# Patient Record
Sex: Male | Born: 1960 | Race: White | Hispanic: No | Marital: Married | Smoking: Never smoker
Health system: Southern US, Community
[De-identification: ages and names within clinical notes are randomized; demographics above are authoritative.]

## PROBLEM LIST (undated history)

## (undated) DIAGNOSIS — E785 Hyperlipidemia, unspecified: Secondary | ICD-10-CM

## (undated) DIAGNOSIS — I10 Essential (primary) hypertension: Secondary | ICD-10-CM

## (undated) HISTORY — PX: MANDIBLE SURGERY: SHX707

## (undated) HISTORY — PX: APPENDECTOMY: SHX54

## (undated) HISTORY — PX: NOSE SURGERY: SHX723

---

## 2011-12-12 ENCOUNTER — Emergency Department: Payer: Self-pay | Admitting: Emergency Medicine

## 2019-10-07 ENCOUNTER — Other Ambulatory Visit: Payer: Self-pay

## 2019-10-07 ENCOUNTER — Encounter (HOSPITAL_COMMUNITY): Payer: Self-pay

## 2019-10-07 ENCOUNTER — Emergency Department (HOSPITAL_COMMUNITY)
Admission: EM | Admit: 2019-10-07 | Discharge: 2019-10-07 | Disposition: A | Discharge status: Home / Self Care | Payer: 59 | Attending: Emergency Medicine | Admitting: Emergency Medicine

## 2019-10-07 ENCOUNTER — Emergency Department (HOSPITAL_COMMUNITY): Payer: 59

## 2019-10-07 DIAGNOSIS — U071 COVID-19: Secondary | ICD-10-CM | POA: Insufficient documentation

## 2019-10-07 DIAGNOSIS — I10 Essential (primary) hypertension: Secondary | ICD-10-CM | POA: Insufficient documentation

## 2019-10-07 DIAGNOSIS — Z79899 Other long term (current) drug therapy: Secondary | ICD-10-CM | POA: Insufficient documentation

## 2019-10-07 DIAGNOSIS — R509 Fever, unspecified: Secondary | ICD-10-CM | POA: Diagnosis not present

## 2019-10-07 DIAGNOSIS — M7918 Myalgia, other site: Secondary | ICD-10-CM | POA: Diagnosis not present

## 2019-10-07 DIAGNOSIS — R05 Cough: Secondary | ICD-10-CM | POA: Insufficient documentation

## 2019-10-07 DIAGNOSIS — R519 Headache, unspecified: Secondary | ICD-10-CM | POA: Insufficient documentation

## 2019-10-07 DIAGNOSIS — J1289 Other viral pneumonia: Secondary | ICD-10-CM | POA: Insufficient documentation

## 2019-10-07 DIAGNOSIS — M549 Dorsalgia, unspecified: Secondary | ICD-10-CM | POA: Diagnosis not present

## 2019-10-07 DIAGNOSIS — J1282 Pneumonia due to coronavirus disease 2019: Secondary | ICD-10-CM

## 2019-10-07 DIAGNOSIS — R0602 Shortness of breath: Secondary | ICD-10-CM | POA: Diagnosis present

## 2019-10-07 HISTORY — DX: Hyperlipidemia, unspecified: E78.5

## 2019-10-07 HISTORY — DX: Essential (primary) hypertension: I10

## 2019-10-07 LAB — CBC WITH DIFFERENTIAL/PLATELET
Abs Immature Granulocytes: 0.03 10*3/uL (ref 0.00–0.07)
Basophils Absolute: 0 10*3/uL (ref 0.0–0.1)
Basophils Relative: 0 %
Eosinophils Absolute: 0 10*3/uL (ref 0.0–0.5)
Eosinophils Relative: 0 %
HCT: 41.5 % (ref 39.0–52.0)
Hemoglobin: 14.4 g/dL (ref 13.0–17.0)
Immature Granulocytes: 1 %
Lymphocytes Relative: 13 %
Lymphs Abs: 0.8 10*3/uL (ref 0.7–4.0)
MCH: 31.4 pg (ref 26.0–34.0)
MCHC: 34.7 g/dL (ref 30.0–36.0)
MCV: 90.4 fL (ref 80.0–100.0)
Monocytes Absolute: 0.7 10*3/uL (ref 0.1–1.0)
Monocytes Relative: 11 %
Neutro Abs: 4.7 10*3/uL (ref 1.7–7.7)
Neutrophils Relative %: 75 %
Platelets: 159 10*3/uL (ref 150–400)
RBC: 4.59 MIL/uL (ref 4.22–5.81)
RDW: 13.1 % (ref 11.5–15.5)
WBC: 6.2 10*3/uL (ref 4.0–10.5)
nRBC: 0 % (ref 0.0–0.2)

## 2019-10-07 LAB — BASIC METABOLIC PANEL
Anion gap: 9 (ref 5–15)
BUN: 19 mg/dL (ref 6–20)
CO2: 20 mmol/L — ABNORMAL LOW (ref 22–32)
Calcium: 8.1 mg/dL — ABNORMAL LOW (ref 8.9–10.3)
Chloride: 111 mmol/L (ref 98–111)
Creatinine, Ser: 0.97 mg/dL (ref 0.61–1.24)
GFR calc Af Amer: >60 mL/min (ref >60)
GFR calc non Af Amer: >60 mL/min (ref >60)
Glucose, Bld: 119 mg/dL — ABNORMAL HIGH (ref 70–99)
Potassium: 3.8 mmol/L (ref 3.5–5.1)
Sodium: 140 mmol/L (ref 135–145)

## 2019-10-07 LAB — CBG MONITORING, ED: Glucose-Capillary: 96 mg/dL (ref 70–99)

## 2019-10-07 MED ORDER — SODIUM CHLORIDE 0.9 % IV BOLUS
1000.0000 mL | Freq: Once | INTRAVENOUS | Status: AC
  Administered 2019-10-07: 20:00:00 1000 mL via INTRAVENOUS

## 2019-10-07 MED ORDER — ACETAMINOPHEN 325 MG PO TABS
650.0000 mg | ORAL_TABLET | Freq: Once | ORAL | Status: AC
  Administered 2019-10-07: 650 mg via ORAL
  Filled 2019-10-07: qty 2

## 2019-10-07 MED ORDER — ALBUTEROL SULFATE HFA 108 (90 BASE) MCG/ACT IN AERS
1.0000 | INHALATION_SPRAY | Freq: Once | RESPIRATORY_TRACT | Status: AC
  Administered 2019-10-07: 2 via RESPIRATORY_TRACT
  Filled 2019-10-07: qty 6.7

## 2019-10-07 NOTE — ED Notes (Signed)
Walking Sa02 91% on RA

## 2019-10-07 NOTE — ED Provider Notes (Addendum)
[facility] EMERGENCY DEPARTMENT Provider Note   CSN: [id number] Arrival date & time: 10/07/19  1944     History   Chief Complaint Chief Complaint  Patient presents with   Shortness of Breath    HPI Steve Briggs is a 58 y.o. male who presents with SOB and syncope. PMH significant for HTN and HLD. He states that he was diagnosed with COVID-19 9 days ago. Overall he has been doing well up until today. He states that throughout the week he has had a headache, fever, body aches but symptoms have been manageable. Today he has had worsening SOB, worsening dry cough, generalized weakness, and back pain. He states that his back hurts mainly when he coughs. After a bad coughing fit he got up to get his keys and then had a sudden syncopal episode. Then he decided to call EMS because he didn't want to drive. His wife also has COVID and is [facility]. He denies chest pain, abdominal pain. He had N/V earlier in the week which has resolved. No diarrhea or urinary symptoms. He has been eating and drinking.      HPI  Past Medical History:  Diagnosis Date   Hyperlipidemia    Hypertension     There are no active problems to display for this patient.   Past Surgical History:  Procedure Laterality Date   APPENDECTOMY     MANDIBLE SURGERY     NOSE SURGERY          Home Medications    Prior to Admission medications   Medication Sig Start Date End Date Taking? Authorizing Provider  Ascorbic Acid (VITAMIN C PO) Take 1 tablet by mouth daily.   Yes [provider]  ibuprofen (ADVIL) 200 MG tablet Take 800 mg by mouth every 6 (six) hours as needed for moderate pain.   Yes [provider]  losartan (COZAAR) 100 MG tablet Take 100 mg by mouth daily. 09/24/19  Yes [provider]  Multiple Vitamins-Minerals (ZINC PO) Take 1 tablet by mouth daily.   Yes [provider]  simvastatin (ZOCOR) 20 MG tablet Take 20 mg  by mouth daily. 09/02/19  Yes [provider]  topiramate (TOPAMAX) 50 MG tablet Take 50 mg by mouth daily. 09/22/19  Yes [provider]  VITAMIN D PO Take 1 tablet by mouth daily.   Yes [provider]    Family History No family history on file.  Social History Social History   Tobacco Use   Smoking status: Never Smoker   Smokeless tobacco: Never Used  Substance Use Topics   Alcohol use: Yes    Comment: occ   Drug use: Never     Allergies   Codeine   Review of Systems Review of Systems  Constitutional: Positive for fatigue and fever.  Respiratory: Positive for cough and shortness of breath.   Cardiovascular: Negative for chest pain and leg swelling.  Gastrointestinal: Positive for nausea and vomiting (resolved). Negative for abdominal pain and diarrhea.  Genitourinary: Negative for difficulty urinating.  Musculoskeletal: Positive for back pain.  Neurological: Positive for syncope, weakness (generalized) and headaches.  All other systems reviewed and are negative.    Physical Exam Updated Vital Signs BP 131/88    Pulse 79    Temp (!) 100.6 F (38.1 C) (Oral)    Resp 18    Ht 5\' 11"  (1.803 m)    Wt 82.6 kg    SpO2 95%  BMI 25.38 kg/m   Physical Exam Vitals signs and nursing note reviewed.  Constitutional:      General: He is not in acute distress.    Appearance: Normal appearance. He is well-developed. He is not ill-appearing.     Comments: Calm, cooperative. NAD  HENT:     Head: Normocephalic and atraumatic.  Eyes:     General: No scleral icterus.       Right eye: No discharge.        Left eye: No discharge.     Conjunctiva/sclera: Conjunctivae normal.     Pupils: Pupils are equal, round, and reactive to light.  Neck:     Musculoskeletal: Normal range of motion.  Cardiovascular:     Rate and Rhythm: Normal rate and regular rhythm.  Pulmonary:     Effort: Pulmonary effort is normal. No respiratory distress.     Breath  sounds: Normal breath sounds.  Abdominal:     General: There is no distension.     Palpations: Abdomen is soft.     Tenderness: There is no abdominal tenderness.  Skin:    General: Skin is warm and dry.  Neurological:     Mental Status: He is alert and oriented to person, place, and time.  Psychiatric:        Behavior: Behavior normal.      ED Treatments / Results  Labs (all labs ordered are listed, but only abnormal results are displayed) Labs Reviewed  BASIC METABOLIC PANEL - Abnormal; Notable for the following components:      Result Value   CO2 20 (*)    Glucose, Bld 119 (*)    Calcium 8.1 (*)    All other components within normal limits  CBC WITH DIFFERENTIAL/PLATELET  CBG MONITORING, ED    EKG EKG Interpretation  Date/Time:  Sunday October 07 2019 19:48:07 EDT Ventricular Rate:  84 PR Interval:    QRS Duration: 98 QT Interval:  390 QTC Calculation: 461 R Axis:   6 Text Interpretation:  Sinus rhythm Abnormal R-wave progression, early transition Left ventricular hypertrophy Borderline T abnormalities, inferior leads Confirmed by Virgina Norfolk [phone]) on 10/07/2019 7:52:00 PM   Radiology Dg Chest Port 1 View  Result Date: 10/07/2019 CLINICAL DATA:  58 year old male with shortness of breath. Syncope today. Positive for COVID-19 about 10 days ago. EXAM: PORTABLE CHEST 1 VIEW COMPARISON:  None. FINDINGS: Portable AP semi upright view at 2006 hours. Confluent but indistinct asymmetric peripheral left lung opacity. Low normal lung volumes. Normal cardiac size and mediastinal contours. Visualized tracheal air column is within normal limits. The right lung is clear allowing for portable technique. No pleural effusion. Paucity of bowel gas. No acute osseous abnormality identified. IMPRESSION: Indistinct peripheral opacity in the left lung compatible with COVID-19 pneumonia. No pleural effusion. Electronically Signed   By: Odessa Fleming M.D.   On: 10/07/2019 20:37     Procedures Procedures (including critical care time)  Medications Ordered in ED Medications  albuterol (VENTOLIN HFA) 108 (90 Base) MCG/ACT inhaler 1-2 puff (has no administration in time range)  sodium chloride 0.9 % bolus 1,000 mL (0 mLs Intravenous Stopped 10/07/19 2209)  acetaminophen (TYLENOL) tablet 650 mg (650 mg Oral Given 10/07/19 2128)     Initial Impression / Assessment and Plan / ED Course  I have reviewed the triage vital signs and the nursing notes.  Pertinent labs & imaging results that were available during my care of the patient were reviewed by me and considered in  my medical decision making (see chart for details).  58 year old male presents with SOB, cough, syncopal episode. He has a low grade fever here but otherwise vitals are normal. He is not in any distress and does not appear very ill. Will obtain labs, CXR. Will give fluids and tylenol as he feels very weak. EKG is SR with LVH and borderline T wave abnormalities.  CXR shows "indistinct peripheral opacity" in the left lung. Labs are essentially unremarkable. After fluids he feels better. He ambulated in the room and sats dropped to only 91%. At rest he is 95-96%. I have low suspicion for PE as he has no clinical signs of DVT and he does not complain of chest pain and has a clear reason for his SOB. At this time, supportive care is recommended. He was given strict return precautions. He has a pulse oximeter and can monitor his sats at home.    Final Clinical Impressions(s) / ED Diagnoses   Final diagnoses:  Pneumonia due to COVID-19 virus    ED Discharge Orders    None           Recardo Evangelist, PA-C 10/07/19 2335    Lennice Sites, DO 10/08/19 0007

## 2019-10-07 NOTE — ED Triage Notes (Signed)
Pt arrived via [facility], pt COVID +, 10 days into it. Pt started having increased SOB and worse cough that started 3 hrs ago. Pt had syncopal episode today.  Pt A&Ox4. Pt is NSR on monitor. Skin color is WNL.

## 2019-10-07 NOTE — Discharge Instructions (Signed)
Use inhaler as needed for shortness of breath Take over the counter cough/cold medicine as needed Please monitor your O2 sats and return to the hospital if your O2 is persistently below 94% or your are becoming more short of breath

## 2019-10-07 NOTE — ED Notes (Signed)
Patient transported to MRI 

## 2020-12-31 DIAGNOSIS — I671 Cerebral aneurysm, nonruptured: Secondary | ICD-10-CM | POA: Diagnosis not present

## 2020-12-31 DIAGNOSIS — G43009 Migraine without aura, not intractable, without status migrainosus: Secondary | ICD-10-CM | POA: Diagnosis not present

## 2020-12-31 DIAGNOSIS — K219 Gastro-esophageal reflux disease without esophagitis: Secondary | ICD-10-CM | POA: Diagnosis not present

## 2020-12-31 DIAGNOSIS — I10 Essential (primary) hypertension: Secondary | ICD-10-CM | POA: Diagnosis not present

## 2021-01-15 DIAGNOSIS — L814 Other melanin hyperpigmentation: Secondary | ICD-10-CM | POA: Diagnosis not present

## 2021-01-15 DIAGNOSIS — D1801 Hemangioma of skin and subcutaneous tissue: Secondary | ICD-10-CM | POA: Diagnosis not present

## 2021-01-15 DIAGNOSIS — X32XXXS Exposure to sunlight, sequela: Secondary | ICD-10-CM | POA: Diagnosis not present

## 2021-01-15 DIAGNOSIS — L821 Other seborrheic keratosis: Secondary | ICD-10-CM | POA: Diagnosis not present

## 2021-03-03 ENCOUNTER — Encounter: Payer: Self-pay | Admitting: Sports Medicine

## 2021-03-03 ENCOUNTER — Ambulatory Visit (INDEPENDENT_AMBULATORY_CARE_PROVIDER_SITE_OTHER): Payer: BC Managed Care – PPO

## 2021-03-03 ENCOUNTER — Ambulatory Visit (INDEPENDENT_AMBULATORY_CARE_PROVIDER_SITE_OTHER): Payer: BC Managed Care – PPO | Admitting: Sports Medicine

## 2021-03-03 ENCOUNTER — Other Ambulatory Visit: Payer: Self-pay

## 2021-03-03 DIAGNOSIS — M7502 Adhesive capsulitis of left shoulder: Secondary | ICD-10-CM

## 2021-03-03 NOTE — Assessment & Plan Note (Signed)
3 months of pain, loss of motion and weakness in the left shoulder, no discrete injuries. On exam he has significant lag and external rotation. He is also very weak to abduction. I do think he has adhesive capsulitis, and likely torn supraspinatus. Today we injected her glenohumeral joint. Getting some x-rays, formal PT, MRI. Return to see me in approximately 6 weeks.

## 2021-03-03 NOTE — Progress Notes (Signed)
    Procedures performed today:    Procedure: Real-time Ultrasound Guided injection of the left glenohumeral joint Device: Samsung HS60  Verbal informed consent obtained.  Time-out conducted.  Noted no overlying erythema, induration, or other signs of local infection.  Skin prepped in a sterile fashion.  Local anesthesia: Topical Ethyl chloride.  With sterile technique and under real time ultrasound guidance:  Noted mild arthritic changes, 1 cc Kenalog 40, 2 cc lidocaine, 2 cc bupivacaine injected easily Completed without difficulty  Advised to call if fevers/chills, erythema, induration, drainage, or persistent bleeding.  Images permanently stored and available for review in PACS.  Impression: Technically successful ultrasound guided injection.  Independent interpretation of notes and tests performed by another provider:   None.  Brief History, Exam, Impression, and Recommendations:    Adhesive capsulitis of left shoulder 3 months of pain, loss of motion and weakness in the left shoulder, no discrete injuries. On exam he has significant lag and external rotation. He is also very weak to abduction. I do think he has adhesive capsulitis, and likely torn supraspinatus. Today we injected her glenohumeral joint. Getting some x-rays, formal PT, MRI. Return to see me in approximately 6 weeks.    ___________________________________________ Ihor Austin. Benjamin Stain, M.D., ABFM., CAQSM. Primary Care and Sports Medicine [facility]  Adjunct Instructor of Family Medicine  [employer]

## 2021-03-09 ENCOUNTER — Ambulatory Visit (INDEPENDENT_AMBULATORY_CARE_PROVIDER_SITE_OTHER): Payer: BC Managed Care – PPO

## 2021-03-09 ENCOUNTER — Other Ambulatory Visit: Payer: Self-pay

## 2021-03-09 DIAGNOSIS — M7502 Adhesive capsulitis of left shoulder: Secondary | ICD-10-CM

## 2021-03-09 DIAGNOSIS — M25512 Pain in left shoulder: Secondary | ICD-10-CM | POA: Diagnosis not present

## 2021-03-09 DIAGNOSIS — M25511 Pain in right shoulder: Secondary | ICD-10-CM | POA: Diagnosis not present

## 2021-03-16 ENCOUNTER — Ambulatory Visit: Payer: BC Managed Care – PPO | Admitting: Rehabilitative and Restorative Service Providers"

## 2021-03-23 ENCOUNTER — Other Ambulatory Visit: Payer: Self-pay

## 2021-03-23 ENCOUNTER — Encounter: Payer: Self-pay | Admitting: Rehabilitative and Restorative Service Providers"

## 2021-03-23 ENCOUNTER — Ambulatory Visit (INDEPENDENT_AMBULATORY_CARE_PROVIDER_SITE_OTHER): Payer: BC Managed Care – PPO | Admitting: Rehabilitative and Restorative Service Providers"

## 2021-03-23 DIAGNOSIS — M7502 Adhesive capsulitis of left shoulder: Secondary | ICD-10-CM

## 2021-03-23 DIAGNOSIS — R293 Abnormal posture: Secondary | ICD-10-CM | POA: Diagnosis not present

## 2021-03-23 DIAGNOSIS — R29898 Other symptoms and signs involving the musculoskeletal system: Secondary | ICD-10-CM | POA: Diagnosis not present

## 2021-03-23 NOTE — Therapy (Signed)
[facility] [address] [place], [place], [address] Phone: [phone]   Fax:  [phone]  Physical Therapy Evaluation  Patient Details  Name: Steve Briggs MRN: [id number] Date of Birth: 12/23/1961 Referring Provider (PT): Dr Benjamin Stain   Encounter Date: 03/23/2021   PT End of Session - 03/23/21 1730    Visit Number 1    Number of Visits 12    Date for PT Re-Evaluation 05/04/21    PT Start Time 1345    PT Stop Time 1435    PT Time Calculation (min) 50 min    Activity Tolerance Patient tolerated treatment well           Past Medical History:  Diagnosis Date  . Hyperlipidemia   . Hypertension     Past Surgical History:  Procedure Laterality Date  . APPENDECTOMY    . MANDIBLE SURGERY    . NOSE SURGERY      There were no vitals filed for this visit.    Subjective Assessment - 03/23/21 1350    Subjective Patient reports Lt shoulder pain for the past two months with no known injury. He received a cortisone injection 03/03/21 with good improvement. He continues to have limited mobility and pain at end range.    Pertinent History jaw surgery; appendectomy    Currently in Pain? Yes    Pain Score 0-No pain   8/10 with movement   Pain Location Shoulder    Pain Orientation Left    Pain Descriptors / Indicators Nagging    Pain Type Acute pain    Pain Radiating Towards down arm to elbow at times    Pain Onset More than a month ago    Pain Frequency Intermittent    Aggravating Factors  movement to end ranges; lying on the Lt side    Pain Relieving Factors injection; ice              [facility] PT Assessment - 03/23/21 0001      Assessment   Medical Diagnosis Lt adhesive capsulitis    Referring Provider (PT) Dr Benjamin Stain    Onset Date/Surgical Date 01/10/21    Hand Dominance Right    Next MD Visit 04/14/21    Prior Therapy none      Precautions   Precautions None      Restrictions   Weight  Bearing Restrictions No      Balance Screen   Has the patient fallen in the past 6 months Yes    How many times? 1    Has the patient had a decrease in activity level because of a fear of falling?  No    Is the patient reluctant to leave their home because of a fear of falling?  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    Leisure golf; yard work; gym - cardio machines free weight ~ 45 min 3 x/wk      Observation/Other Assessments   Focus on Therapeutic Outcomes (FOTO)  57      Sensation   Additional Comments WFL's per pt report      Posture/Postural Control   Posture Comments head forward; shoulders rounded and elevated; head of the humerus anterior in orientation;  scapulae abducted and rotated along the thoracic wall      AROM   Right/Left Shoulder --   Lt shoulder pain with movements   Right Shoulder Extension 47 Degrees    Right Shoulder Flexion 129 Degrees    Right Shoulder ABduction 135 Degrees    Right Shoulder Internal Rotation --   thumb to T10/11   Right Shoulder External Rotation 78 Degrees    Left Shoulder Extension 41 Degrees    Left Shoulder Flexion 115 Degrees    Left Shoulder ABduction 103 Degrees    Left Shoulder Internal Rotation --   thumb to lateral trunk ~ L1/2   Left Shoulder External Rotation 30 Degrees      Strength   Overall Strength Comments WFL's bialt UE's      Palpation   Spinal mobility hypomobility thoraic spine with PA mobs    Palpation comment muscular tightness Lt > Rt paraspinals; pecs; upper trap; deltoid                      Objective measurements completed on examination: See above findings.       [facility] Adult PT Treatment/Exercise - 03/23/21 0001      Shoulder Exercises: Standing   Other  Standing Exercises axial extension 10 sec x 5 reps; scap squeeze 10 sec x 5 reps; L's x 10; W's x 10 with foam roll along spine      Shoulder Exercises: Pulleys   Flexion Limitations 10 sec x 5 reps    Scaption Limitations 10 sec x 5 reps      Shoulder Exercises: Stretch   Table Stretch - Flexion 3 reps;10 seconds    Table Stretch -Flexion Limitations stepping back at counter      Cryotherapy   Number Minutes Cryotherapy 10 Minutes    Cryotherapy Location Shoulder   Lt   Type of Cryotherapy Ice pack                  PT Education - 03/23/21 1423    Education Details HEP POC    Person(s) Educated Patient    Methods Explanation;Demonstration;Tactile cues;Verbal cues;Handout    Comprehension Verbalized understanding;Returned demonstration;Verbal cues required;Tactile cues required               PT Long Term Goals - 03/23/21 1735      PT LONG TERM GOAL #1   Title Improve posture and alignment with patient to demonstrate improved thoracic extension and upright posture with posterior shoulder girdle musculature engaged    Time 6    Period Weeks    Status New    Target Date 05/04/21      PT LONG TERM GOAL #2   Title Increase AROM Lt shoulder to =/> than AROM Rt shoulder with minimal to no pain    Time 6    Period Weeks    Status New    Target Date 05/04/21      PT LONG TERM GOAL #3   Title Patient to report ability to return to normal functional and recreational activites with minimal to no pain in Lt shoulder    Time 6    Period Weeks    Status New    Target Date 05/04/21      PT LONG TERM GOAL #4   Title Independent in HEP    Time 6    Period Weeks    Status New    Target Date 05/04/21  PT LONG TERM GOAL #5   Title Improve functional limitation score to 73    Time 6    Period Weeks    Status New    Target Date 05/04/21                  Plan - 03/23/21 1730    Clinical Impression Statement Patient presents with ~ 2 month history of Lt  shoulder pain with no known injury. He received an injection 03/03/21 with significant improvement. Patient continues to have limited ROM Lt shoulder and pain with end range movements throughout; poor posture and alignment; poor shoulder kinematics and control; pain limiting sleep. Patient will benefit from PT to address problems identified.    Stability/Clinical Decision Making Stable/Uncomplicated    Clinical Decision Making Low    Rehab Potential Good    PT Frequency 2x / week    PT Duration 6 weeks    PT Treatment/Interventions ADLs/Self Care Home Management;Aquatic Therapy;Cryotherapy;Electrical Stimulation;Iontophoresis 4mg /ml Dexamethasone;Moist Heat;Ultrasound;Functional mobility training;Therapeutic activities;Therapeutic exercise;Neuromuscular re-education;Patient/family education;Manual techniques;Passive range of motion;Dry needling;Taping;Vasopneumatic Device    PT Next Visit Plan review and progress ROM exercises; add manual work and ; work on Armed forces operational officer; modlaities as indicated    PT Home Exercise Plan [phone]    Consulted and Agree with Plan of Care Patient           Patient will benefit from skilled therapeutic intervention in order to improve the following deficits and impairments:  Decreased range of motion,Increased fascial restricitons,Increased muscle spasms,Impaired UE functional use,Decreased activity tolerance,Pain,Impaired flexibility,Improper body mechanics,Decreased mobility,Postural dysfunction  Visit Diagnosis: Adhesive capsulitis of left shoulder  Abnormal posture  Other symptoms and signs involving the musculoskeletal system     Problem List Patient Active Problem List   Diagnosis Date Noted  . Adhesive capsulitis of left shoulder 03/03/2021    Steve Briggs 05/03/2021 PT, MPH  03/23/2021, 5:39 PM  [facility] [address] [place], [place], [place] Phone: [phone]    Fax:  [phone]  Name: Steve Briggs MRN: [name] Date of Birth: 09/02/1961

## 2021-03-23 NOTE — Patient Instructions (Signed)
Access Code: [email or web address]: [email or web address]: 03/28/2022Prepared by: Prosper Paff HoltExercises  Seated Cervical Retraction - 2 x daily - 7 x weekly - 1-2 sets - 5-10 reps - 10 sec hold  Seated Scapular Retraction - 2 x daily - 7 x weekly - 1-2 sets - 10 reps - 10 sec hold  Shoulder External Rotation and Scapular Retraction - 2 x daily - 7 x weekly - 1 sets - 10 reps - 3-5 sec hold  Shoulder External Rotation in 45 Degrees Abduction - 2 x daily - 7 x weekly - 1-2 sets - 10 reps - 3-5 sec hold  Standing Shoulder and Trunk Flexion at Table - 2 x daily - 7 x weekly - 1 sets - 3-5 reps - 5-10 sec hold  Seated Shoulder Flexion AAROM with Pulley Behind - 2 x daily - 7 x weekly - 1 sets - 10 reps - 10 sec hold  Seated Shoulder Scaption AAROM with Pulley at Side - 2 x daily - 7 x weekly - 1 sets - 10 reps - 10sec hold

## 2021-03-26 ENCOUNTER — Encounter: Payer: Self-pay | Admitting: Rehabilitative and Restorative Service Providers"

## 2021-03-26 ENCOUNTER — Other Ambulatory Visit: Payer: Self-pay

## 2021-03-26 ENCOUNTER — Ambulatory Visit (INDEPENDENT_AMBULATORY_CARE_PROVIDER_SITE_OTHER): Payer: BC Managed Care – PPO | Admitting: Rehabilitative and Restorative Service Providers"

## 2021-03-26 DIAGNOSIS — M7502 Adhesive capsulitis of left shoulder: Secondary | ICD-10-CM

## 2021-03-26 DIAGNOSIS — R29898 Other symptoms and signs involving the musculoskeletal system: Secondary | ICD-10-CM | POA: Diagnosis not present

## 2021-03-26 DIAGNOSIS — R293 Abnormal posture: Secondary | ICD-10-CM | POA: Diagnosis not present

## 2021-03-26 NOTE — Patient Instructions (Signed)
Access Code: [email or web address]: [email or web address]: 03/31/2022Prepared by: Jalayla Chrismer HoltExercises  Seated Cervical Retraction - 2 x daily - 7 x weekly - 1-2 sets - 5-10 reps - 10 sec hold  Seated Scapular Retraction - 2 x daily - 7 x weekly - 1-2 sets - 10 reps - 10 sec hold  Shoulder External Rotation and Scapular Retraction - 2 x daily - 7 x weekly - 1 sets - 10 reps - 3-5 sec hold  Shoulder External Rotation in 45 Degrees Abduction - 2 x daily - 7 x weekly - 1-2 sets - 10 reps - 3-5 sec hold  Standing Shoulder and Trunk Flexion at Table - 2 x daily - 7 x weekly - 1 sets - 3-5 reps - 5-10 sec hold  Seated Shoulder Flexion AAROM with Pulley Behind - 2 x daily - 7 x weekly - 1 sets - 10 reps - 10 sec hold  Seated Shoulder Scaption AAROM with Pulley at Side - 2 x daily - 7 x weekly - 1 sets - 10 reps - 10sec hold  Seated Shoulder External Rotation AAROM with Cane and Hand in Neutral - 2 x daily - 7 x weekly - 1 sets - 5-10 reps - 5-10 sec hold  Standing Shoulder External Rotation with Resistance - 2 x daily - 7 x weekly - 1-3 sets - 10 reps - 2-3 sec hold  Standing Bilateral Low Shoulder Row with Anchored Resistance - 2 x daily - 7 x weekly - 1-3 sets - 10 reps - 2-3 sec hold  Shoulder Extension with Resistance - 2 x daily - 7 x weekly - 1-3 sets - 10 reps - 2-3 sec hold  Standing Shoulder Extension ROM with Dowel - 2 x daily - 7 x weekly - 1 sets - 3 reps - 30 sec hold  Standing Shoulder Internal Rotation Stretch with Towel - 2 x daily - 7 x weekly - 1 sets - 3-5 reps - 15-20 sec hold  Circular Shoulder Pendulum with Table Support - 3-4 x daily - 7 x weekly - 1 sets - 20-30 reps

## 2021-03-26 NOTE — Therapy (Signed)
[facility] [place] [address] [place], [place], [address] Phone: [phone]   Fax:  [phone]  Physical Therapy Treatment  Patient Details  Name: Steve Briggs MRN: [id number] Date of Birth: March 18, 1961 Referring Provider (PT): Dr Benjamin Stain   Encounter Date: 03/26/2021   PT End of Session - 03/26/21 1403    Visit Number 2    Number of Visits 12    Date for PT Re-Evaluation 05/04/21    PT Start Time 1402    PT Stop Time 1450    PT Time Calculation (min) 48 min    Activity Tolerance Patient tolerated treatment well           Past Medical History:  Diagnosis Date  . Hyperlipidemia   . Hypertension     Past Surgical History:  Procedure Laterality Date  . APPENDECTOMY    . MANDIBLE SURGERY    . NOSE SURGERY      There were no vitals filed for this visit.   Subjective Assessment - 03/26/21 1403    Subjective Patient reports that he bought a pully for home and is using it twice a day. Feels that his shoulder may be a little bit better.    Currently in Pain? No/denies    Pain Score 0-No pain                             [facility] Adult PT Treatment/Exercise - 03/26/21 0001      Shoulder Exercises: Supine   Other Supine Exercises prolonged snow angel with noodle - ~ 2 min arms at ~ 60 deg abduction      Shoulder Exercises: Standing   Extension Strengthening;Both;10 reps;Theraband    Theraband Level (Shoulder Extension) Level 2 (Red)    Row Strengthening;Both;10 reps;Theraband    Theraband Level (Shoulder Row) Level 2 (Red)    Retraction Strengthening;Both;10 reps;Theraband    Theraband Level (Shoulder Retraction) Level 2 (Red)    Other Standing Exercises axial extension 10 sec x 5 reps; scap squeeze 10 sec x 5 reps; L's x 10; W's x 10 with foam roll along spine      Shoulder Exercises: Pulleys   Flexion Limitations 10 sec x 5 reps    Scaption Limitations 10 sec x 5 reps      Shoulder  Exercises: ROM/Strengthening   Pendulum 30-35 CW/CCW 3# wt in hand      Shoulder Exercises: Stretch   Internal Rotation Stretch 3 reps   10-15 sec hold pillowcase behind back   External Rotation Stretch 3 reps;20 seconds   standing at noodle cane - elbow at 90 deg flex   Table Stretch - Flexion 3 reps;10 seconds    Table Stretch -Flexion Limitations stepping back at counter    Other Shoulder Stretches shd extension cane behind back 10 - 15 sec x 3 reps    Other Shoulder Stretches trunk rotation arms ~ 60 deg abd      Cryotherapy   Number Minutes Cryotherapy 10 Minutes    Cryotherapy Location Shoulder   Lt   Type of Cryotherapy Ice pack      Manual Therapy   Manual therapy comments pt supine    Joint Mobilization GH multiplanes Grade II/III    Soft tissue mobilization anterior shoulder/pecs/biceps/teres/periscapular musculature    Scapular Mobilization Lt laterally    Passive ROM Lt shoulder flexin; scaption; ER in scapular plane    Manual Traction long arm  with jiggle                  PT Education - 03/26/21 1424    Education Details HEP    Person(s) Educated Patient    Methods Explanation;Demonstration;Tactile cues;Verbal cues;Handout    Comprehension Verbalized understanding;Returned demonstration;Verbal cues required;Tactile cues required               PT Long Term Goals - 03/23/21 1735      PT LONG TERM GOAL #1   Title Improve posture and alignment with patient to demonstrate improved thoracic extension and upright posture with posterior shoulder girdle musculature engaged    Time 6    Period Weeks    Status New    Target Date 05/04/21      PT LONG TERM GOAL #2   Title Increase AROM Lt shoulder to =/> than AROM Rt shoulder with minimal to no pain    Time 6    Period Weeks    Status New    Target Date 05/04/21      PT LONG TERM GOAL #3   Title Patient to report ability to return to normal functional and recreational activites with minimal to no pain  in Lt shoulder    Time 6    Period Weeks    Status New    Target Date 05/04/21      PT LONG TERM GOAL #4   Title Independent in HEP    Time 6    Period Weeks    Status New    Target Date 05/04/21      PT LONG TERM GOAL #5   Title Improve functional limitation score to 73    Time 6    Period Weeks    Status New    Target Date 05/04/21                 Plan - 03/26/21 1405    Clinical Impression Statement Positive response to initial treatment and HEP. Working on exercises at home. Added exercises in the clinic. Added manual work and Armed forces operational officer. Good gains in ROM/mobility with HEP and treatment    Rehab Potential Good    PT Frequency 2x / week    PT Duration 6 weeks    PT Treatment/Interventions ADLs/Self Care Home Management;Aquatic Therapy;Cryotherapy;Electrical Stimulation;Iontophoresis 4mg /ml Dexamethasone;Moist Heat;Ultrasound;Functional mobility training;Therapeutic activities;Therapeutic exercise;Neuromuscular re-education;Patient/family education;Manual techniques;Passive range of motion;Dry needling;Taping;Vasopneumatic Device    PT Next Visit Plan review and progress ROM exercises; assess response to manual work and ; work on Armed forces operational officer; modlaities as indicated    PT Home Exercise Plan [phone]    Consulted and Agree with Plan of Care Patient           Patient will benefit from skilled therapeutic intervention in order to improve the following deficits and impairments:     Visit Diagnosis: Adhesive capsulitis of left shoulder  Abnormal posture  Other symptoms and signs involving the musculoskeletal system     Problem List Patient Active Problem List   Diagnosis Date Noted  . Adhesive capsulitis of left shoulder 03/03/2021    Malosi Hemstreet 05/03/2021 PT, MPH  03/26/2021, 2:47 PM  [facility] [address] [place], [place], [place] Phone: [phone]   Fax:   [phone]  Name: RENAN DANESE MRN: [name] Date of Birth: 1961-05-20

## 2021-03-27 DIAGNOSIS — M6281 Muscle weakness (generalized): Secondary | ICD-10-CM | POA: Diagnosis not present

## 2021-03-27 DIAGNOSIS — R2689 Other abnormalities of gait and mobility: Secondary | ICD-10-CM | POA: Diagnosis not present

## 2021-03-27 DIAGNOSIS — M419 Scoliosis, unspecified: Secondary | ICD-10-CM | POA: Diagnosis not present

## 2021-03-30 ENCOUNTER — Encounter: Payer: Self-pay | Admitting: Rehabilitative and Restorative Service Providers"

## 2021-03-30 ENCOUNTER — Ambulatory Visit (INDEPENDENT_AMBULATORY_CARE_PROVIDER_SITE_OTHER): Payer: BC Managed Care – PPO | Admitting: Rehabilitative and Restorative Service Providers"

## 2021-03-30 ENCOUNTER — Other Ambulatory Visit: Payer: Self-pay

## 2021-03-30 DIAGNOSIS — M7502 Adhesive capsulitis of left shoulder: Secondary | ICD-10-CM | POA: Diagnosis not present

## 2021-03-30 DIAGNOSIS — R29898 Other symptoms and signs involving the musculoskeletal system: Secondary | ICD-10-CM

## 2021-03-30 DIAGNOSIS — R2689 Other abnormalities of gait and mobility: Secondary | ICD-10-CM | POA: Diagnosis not present

## 2021-03-30 DIAGNOSIS — M419 Scoliosis, unspecified: Secondary | ICD-10-CM | POA: Diagnosis not present

## 2021-03-30 DIAGNOSIS — R293 Abnormal posture: Secondary | ICD-10-CM | POA: Diagnosis not present

## 2021-03-30 DIAGNOSIS — M6281 Muscle weakness (generalized): Secondary | ICD-10-CM | POA: Diagnosis not present

## 2021-03-30 NOTE — Patient Instructions (Signed)
Access Code: [email or web address]: [email or web address]: 04/04/2022Prepared by: Colton Engdahl HoltExercises  Seated Cervical Retraction - 2 x daily - 7 x weekly - 1-2 sets - 5-10 reps - 10 sec hold  Seated Scapular Retraction - 2 x daily - 7 x weekly - 1-2 sets - 10 reps - 10 sec hold  Shoulder External Rotation and Scapular Retraction - 2 x daily - 7 x weekly - 1 sets - 10 reps - 3-5 sec hold  Shoulder External Rotation in 45 Degrees Abduction - 2 x daily - 7 x weekly - 1-2 sets - 10 reps - 3-5 sec hold  Standing Shoulder and Trunk Flexion at Table - 2 x daily - 7 x weekly - 1 sets - 3-5 reps - 5-10 sec hold  Seated Shoulder Flexion AAROM with Pulley Behind - 2 x daily - 7 x weekly - 1 sets - 10 reps - 10 sec hold  Seated Shoulder Scaption AAROM with Pulley at Side - 2 x daily - 7 x weekly - 1 sets - 10 reps - 10sec hold  Seated Shoulder External Rotation AAROM with Cane and Hand in Neutral - 2 x daily - 7 x weekly - 1 sets - 5-10 reps - 5-10 sec hold  Standing Shoulder External Rotation with Resistance - 2 x daily - 7 x weekly - 1-3 sets - 10 reps - 2-3 sec hold  Standing Bilateral Low Shoulder Row with Anchored Resistance - 2 x daily - 7 x weekly - 1-3 sets - 10 reps - 2-3 sec hold  Shoulder Extension with Resistance - 2 x daily - 7 x weekly - 1-3 sets - 10 reps - 2-3 sec hold  Standing Shoulder Extension ROM with Dowel - 2 x daily - 7 x weekly - 1 sets - 3 reps - 30 sec hold  Standing Shoulder Internal Rotation Stretch with Towel - 2 x daily - 7 x weekly - 1 sets - 3-5 reps - 15-20 sec hold  Circular Shoulder Pendulum with Table Support - 3-4 x daily - 7 x weekly - 1 sets - 20-30 reps  Shoulder External Rotation Reactive Isometrics - 2 x daily - 7 x weekly - 1 sets - 10 reps - 3 sec hold  Shoulder Internal Rotation Reactive Isometrics - 2 x daily - 7 x weekly - 1 sets - 3 reps - 30 sec hold

## 2021-03-30 NOTE — Therapy (Signed)
[facility] Outpatient Rehabilitation [place] [address] [place], [place], [address] Phone: [phone]   Fax:  [phone]  Physical Therapy Treatment  Patient Details  Name: Steve Briggs MRN: [id number] Date of Birth: [birth year] Referring Provider (PT): Dr Benjamin Stain   Encounter Date: 03/30/2021   PT End of Session - 03/30/21 1425    Visit Number 3    Number of Visits 12    Date for PT Re-Evaluation 05/04/21    PT Start Time 1424    PT Stop Time 1512    PT Time Calculation (min) 48 min    Activity Tolerance Patient tolerated treatment well           Past Medical History:  Diagnosis Date  . Hyperlipidemia   . Hypertension     Past Surgical History:  Procedure Laterality Date  . APPENDECTOMY    . MANDIBLE SURGERY    . NOSE SURGERY      There were no vitals filed for this visit.   Subjective Assessment - 03/30/21 1426    Subjective Patient reports that he is making progress. notices that he can move his arm better and is having less pain. Sleeping better but still awakens at night due pain.    Currently in Pain? No/denies    Pain Score 0-No pain    Pain Location Shoulder    Pain Orientation Left              [facility] PT Assessment - 03/30/21 0001      Assessment   Medical Diagnosis Lt adhesive capsulitis    Referring Provider (PT) Dr Benjamin Stain    Onset Date/Surgical Date 01/10/21    Hand Dominance Right    Next MD Visit 04/14/21    Prior Therapy none      PROM   Left Shoulder Extension 55 Degrees    Left Shoulder Flexion 145 Degrees    Left Shoulder ABduction 140 Degrees   in scapular plane   Left Shoulder External Rotation 65 Degrees   in scapular plane     Palpation   Spinal mobility hypomobility thoraic spine    Palpation comment muscular tightness Lt > Rt paraspinals; pecs; upper trap; deltoid                         [facility] Adult PT Treatment/Exercise - 03/30/21 0001      Shoulder Exercises:  Supine   Other Supine Exercises prolonged snow angel with noodle - ~ 2 min arms at ~ 70-80 deg abduction    Other Supine Exercises trunk rotation in hooklying to stretch trunk and anterior shoulder x 8 each side      Shoulder Exercises: Standing   External Rotation Strengthening;Left;10 reps;Theraband    Theraband Level (Shoulder External Rotation) Level 3 (Green)    External Rotation Limitations reactive isometric    Internal Rotation Strengthening;Left;10 reps;Theraband    Theraband Level (Shoulder Internal Rotation) Level 3 (Green)    Internal Rotation Limitations reactive isometric    Extension Strengthening;Both;10 reps;Theraband    Theraband Level (Shoulder Extension) Level 3 (Green)    Row Strengthening;Both;10 reps;Theraband    Theraband Level (Shoulder Row) Level 3 (Green)    Row Limitations bow and arrow green TB 10 reps each UE    Retraction Strengthening;Both;10 reps;Theraband    Theraband Level (Shoulder Retraction) Level 2 (Red)      Shoulder Exercises: Pulleys   Flexion Limitations 10 sec x 5 reps  Scaption Limitations 10 sec x 5 reps    Other Pulley Exercises IR 10 sec x 5 reps      Shoulder Exercises: ROM/Strengthening   Pendulum 30-35 CW/CCW 3# wt in hand      Cryotherapy   Number Minutes Cryotherapy 10 Minutes    Cryotherapy Location Shoulder   Lt   Type of Cryotherapy Ice pack      Manual Therapy   Manual therapy comments pt supine    Joint Mobilization GH multiplanes Grade II/III    Soft tissue mobilization anterior shoulder/pecs/biceps/teres/periscapular musculature    Passive ROM Lt shoulder flexin; scaption; ER in scapular plane    Manual Traction long arm with jiggle                  PT Education - 03/30/21 1450    Education Details HEP    Person(s) Educated Patient    Methods Explanation;Demonstration;Tactile cues;Verbal cues;Handout    Comprehension Verbalized understanding;Returned demonstration;Verbal cues required;Tactile cues  required               PT Long Term Goals - 03/23/21 1735      PT LONG TERM GOAL #1   Title Improve posture and alignment with patient to demonstrate improved thoracic extension and upright posture with posterior shoulder girdle musculature engaged    Time 6    Period Weeks    Status New    Target Date 05/04/21      PT LONG TERM GOAL #2   Title Increase AROM Lt shoulder to =/> than AROM Rt shoulder with minimal to no pain    Time 6    Period Weeks    Status New    Target Date 05/04/21      PT LONG TERM GOAL #3   Title Patient to report ability to return to normal functional and recreational activites with minimal to no pain in Lt shoulder    Time 6    Period Weeks    Status New    Target Date 05/04/21      PT LONG TERM GOAL #4   Title Independent in HEP    Time 6    Period Weeks    Status New    Target Date 05/04/21      PT LONG TERM GOAL #5   Title Improve functional limitation score to 73    Time 6    Period Weeks    Status New    Target Date 05/04/21                 Plan - 03/30/21 1438    Clinical Impression Statement Continued improvement in pain; AROM; PROM; function; sleep. Increased resistive exercises for home. Progressing well toward stated goals of therapy.    Rehab Potential Good    PT Frequency 2x / week    PT Duration 6 weeks    PT Treatment/Interventions ADLs/Self Care Home Management;Aquatic Therapy;Cryotherapy;Electrical Stimulation;Iontophoresis 4mg /ml Dexamethasone;Moist Heat;Ultrasound;Functional mobility training;Therapeutic activities;Therapeutic exercise;Neuromuscular re-education;Patient/family education;Manual techniques;Passive range of motion;Dry needling;Taping;Vasopneumatic Device    PT Next Visit Plan review and progress ROM exercises; assess response to manual work and ; work on Armed forces operational officer; modlaities as indicated    PT Home Exercise Plan [phone]    Consulted and Agree with Plan of Care  Patient           Patient will benefit from skilled therapeutic intervention in order to improve the following deficits and impairments:     Visit Diagnosis: Adhesive capsulitis  of left shoulder  Abnormal posture  Other symptoms and signs involving the musculoskeletal system     Problem List Patient Active Problem List   Diagnosis Date Noted  . Adhesive capsulitis of left shoulder 03/03/2021    Khalaya Mcgurn Rober Minion  PT, MPH  03/30/2021, 3:28 PM  [facility] [address] [place], [place], [address] Phone: [phone]   Fax:  [phone]  Name: Steve Briggs MRN: [id number] Date of Birth: September 02, 1961

## 2021-04-01 DIAGNOSIS — M419 Scoliosis, unspecified: Secondary | ICD-10-CM | POA: Diagnosis not present

## 2021-04-01 DIAGNOSIS — R2689 Other abnormalities of gait and mobility: Secondary | ICD-10-CM | POA: Diagnosis not present

## 2021-04-01 DIAGNOSIS — M6281 Muscle weakness (generalized): Secondary | ICD-10-CM | POA: Diagnosis not present

## 2021-04-02 ENCOUNTER — Encounter: Payer: BC Managed Care – PPO | Admitting: Rehabilitative and Restorative Service Providers"

## 2021-04-06 ENCOUNTER — Ambulatory Visit (INDEPENDENT_AMBULATORY_CARE_PROVIDER_SITE_OTHER): Payer: BC Managed Care – PPO | Admitting: Rehabilitative and Restorative Service Providers"

## 2021-04-06 ENCOUNTER — Other Ambulatory Visit: Payer: Self-pay

## 2021-04-06 ENCOUNTER — Encounter: Payer: Self-pay | Admitting: Rehabilitative and Restorative Service Providers"

## 2021-04-06 DIAGNOSIS — R2689 Other abnormalities of gait and mobility: Secondary | ICD-10-CM | POA: Diagnosis not present

## 2021-04-06 DIAGNOSIS — R293 Abnormal posture: Secondary | ICD-10-CM | POA: Diagnosis not present

## 2021-04-06 DIAGNOSIS — R29898 Other symptoms and signs involving the musculoskeletal system: Secondary | ICD-10-CM | POA: Diagnosis not present

## 2021-04-06 DIAGNOSIS — M419 Scoliosis, unspecified: Secondary | ICD-10-CM | POA: Diagnosis not present

## 2021-04-06 DIAGNOSIS — M7502 Adhesive capsulitis of left shoulder: Secondary | ICD-10-CM

## 2021-04-06 DIAGNOSIS — M6281 Muscle weakness (generalized): Secondary | ICD-10-CM | POA: Diagnosis not present

## 2021-04-06 NOTE — Therapy (Signed)
[facility] Out[facility] [place] [address] [place], [place], [address] Phone: [phone]   Fax:  [phone]  Physical Therapy Treatment  Patient Details  Name: Steve Briggs MRN: [id number] Date of Birth: [birth year] Referring Provider (PT): Dr Benjamin Stain   Encounter Date: 04/06/2021   PT End of Session - 04/06/21 1434    Visit Number 4    Number of Visits 12    Date for PT Re-Evaluation 05/04/21    PT Start Time 1430    PT Stop Time 1518    PT Time Calculation (min) 48 min    Activity Tolerance Patient tolerated treatment well           Past Medical History:  Diagnosis Date  . Hyperlipidemia   . Hypertension     Past Surgical History:  Procedure Laterality Date  . APPENDECTOMY    . MANDIBLE SURGERY    . NOSE SURGERY      There were no vitals filed for this visit.       [facility] PT Assessment - 04/06/21 0001      Assessment   Medical Diagnosis Lt adhesive capsulitis    Referring Provider (PT) Dr Benjamin Stain    Onset Date/Surgical Date 01/10/21    Hand Dominance Right    Next MD Visit 04/14/21    Prior Therapy none      AROM   Right Shoulder Extension 47 Degrees    Right Shoulder Flexion 129 Degrees    Right Shoulder ABduction 135 Degrees    Right Shoulder External Rotation 78 Degrees    Left Shoulder Extension 60 Degrees    Left Shoulder Flexion 139 Degrees    Left Shoulder ABduction 148 Degrees    Left Shoulder Internal Rotation --   thumb T10-11   Left Shoulder External Rotation 55 Degrees   UE at side                        [facility] Adult PT Treatment/Exercise - 04/06/21 0001      Shoulder Exercises: Sidelying   External Rotation Strengthening;Left;10 reps;Weights    External Rotation Weight (lbs) 3      Shoulder Exercises: Standing   ABduction Strengthening;Both;10 reps;Weights    Shoulder ABduction Weight (lbs) 3      Shoulder Exercises: Pulleys   Flexion Limitations 10 sec x 5  reps    Scaption Limitations 10 sec x 5 reps      Shoulder Exercises: ROM/Strengthening   UBE (Upper Arm Bike) L4 x 3 min 1.5 fwd/1.5 bck    Pendulum 30-35 CW/CCW 3# wt in hand      Shoulder Exercises: Stretch   Other Shoulder Stretches doorway stretch 30 sec x 3 positions to pt tolerance      Moist Heat Therapy   Number Minutes Moist Heat 10 Minutes    Moist Heat Location Shoulder      Manual Therapy   Manual therapy comments pt supine and sidelying    Joint Mobilization GH multiplanes Grade II/III    Soft tissue mobilization anterior shoulder/pecs/biceps/teres/periscapular musculature    Scapular Mobilization Lt laterally    Passive ROM Lt shoulder flexion; scaption; ER in scapular plane    Manual Traction long arm with jiggle                  PT Education - 04/06/21 1456    Education Details HEP    Person(s) Educated Patient    Methods Explanation;Demonstration;Tactile cues;Verbal  cues;Handout    Comprehension Verbalized understanding;Returned demonstration;Verbal cues required;Tactile cues required               PT Long Term Goals - 03/23/21 1735      PT LONG TERM GOAL #1   Title Improve posture and alignment with patient to demonstrate improved thoracic extension and upright posture with posterior shoulder girdle musculature engaged    Time 6    Period Weeks    Status New    Target Date 05/04/21      PT LONG TERM GOAL #2   Title Increase AROM Lt shoulder to =/> than AROM Rt shoulder with minimal to no pain    Time 6    Period Weeks    Status New    Target Date 05/04/21      PT LONG TERM GOAL #3   Title Patient to report ability to return to normal functional and recreational activites with minimal to no pain in Lt shoulder    Time 6    Period Weeks    Status New    Target Date 05/04/21      PT LONG TERM GOAL #4   Title Independent in HEP    Time 6    Period Weeks    Status New    Target Date 05/04/21      PT LONG TERM GOAL #5   Title  Improve functional limitation score to 73    Time 6    Period Weeks    Status New    Target Date 05/04/21                 Plan - 04/06/21 1435    Clinical Impression Statement Improving with decreased pain and increased ROM. Working on his exercises at home. Sleeping better. Progressing well.    Rehab Potential Good    PT Frequency 2x / week    PT Duration 6 weeks    PT Treatment/Interventions ADLs/Self Care Home Management;Aquatic Therapy;Cryotherapy;Electrical Stimulation;Iontophoresis 4mg /ml Dexamethasone;Moist Heat;Ultrasound;Functional mobility training;Therapeutic activities;Therapeutic exercise;Neuromuscular re-education;Patient/family education;Manual techniques;Passive range of motion;Dry needling;Taping;Vasopneumatic Device    PT Next Visit Plan progress ROM exercises; assess response to manual work and ; work on Armed forces operational officer; modlaities as indicated    PT Home Exercise Plan [phone]    Consulted and Agree with Plan of Care Patient           Patient will benefit from skilled therapeutic intervention in order to improve the following deficits and impairments:     Visit Diagnosis: Adhesive capsulitis of left shoulder  Abnormal posture  Other symptoms and signs involving the musculoskeletal system     Problem List Patient Active Problem List   Diagnosis Date Noted  . Adhesive capsulitis of left shoulder 03/03/2021    Steve Briggs 05/03/2021 PT, MPH  04/06/2021, 5:03 PM  [facility] [address] [place], [place], [place] Phone: [phone]   Fax:  [phone]  Name: Steve Briggs MRN: [name] Date of Birth: 10-Nov-1961

## 2021-04-06 NOTE — Patient Instructions (Signed)
Access Code: [email or web address]: [email or web address]: 04/11/2022Prepared by: Stephano Arrants HoltExercises  Seated Cervical Retraction - 2 x daily - 7 x weekly - 1-2 sets - 5-10 reps - 10 sec hold  Seated Scapular Retraction - 2 x daily - 7 x weekly - 1-2 sets - 10 reps - 10 sec hold  Shoulder External Rotation and Scapular Retraction - 2 x daily - 7 x weekly - 1 sets - 10 reps - 3-5 sec hold  Shoulder External Rotation in 45 Degrees Abduction - 2 x daily - 7 x weekly - 1-2 sets - 10 reps - 3-5 sec hold  Standing Shoulder and Trunk Flexion at Table - 2 x daily - 7 x weekly - 1 sets - 3-5 reps - 5-10 sec hold  Seated Shoulder Flexion AAROM with Pulley Behind - 2 x daily - 7 x weekly - 1 sets - 10 reps - 10 sec hold  Seated Shoulder Scaption AAROM with Pulley at Side - 2 x daily - 7 x weekly - 1 sets - 10 reps - 10sec hold  Seated Shoulder External Rotation AAROM with Cane and Hand in Neutral - 2 x daily - 7 x weekly - 1 sets - 5-10 reps - 5-10 sec hold  Standing Shoulder External Rotation with Resistance - 2 x daily - 7 x weekly - 1-3 sets - 10 reps - 2-3 sec hold  Standing Bilateral Low Shoulder Row with Anchored Resistance - 2 x daily - 7 x weekly - 1-3 sets - 10 reps - 2-3 sec hold  Shoulder Extension with Resistance - 2 x daily - 7 x weekly - 1-3 sets - 10 reps - 2-3 sec hold  Standing Shoulder Extension ROM with Dowel - 2 x daily - 7 x weekly - 1 sets - 3 reps - 30 sec hold  Standing Shoulder Internal Rotation Stretch with Towel - 2 x daily - 7 x weekly - 1 sets - 3-5 reps - 15-20 sec hold  Circular Shoulder Pendulum with Table Support - 3-4 x daily - 7 x weekly - 1 sets - 20-30 reps  Shoulder External Rotation Reactive Isometrics - 2 x daily - 7 x weekly - 1 sets - 10 reps - 3 sec hold  Shoulder Internal Rotation Reactive Isometrics - 2 x daily - 7 x weekly - 1 sets - 3 reps - 30 sec hold  Standing shoulder flexion wall slides - 2 x daily - 7 x weekly - 1 sets - 3 reps - 15-20 sec hold   Shoulder ER Stretch in Abduction - 2 x daily - 7 x weekly - 1 sets - 3 reps - 10-20 sec hold  Bent Over Single Arm Shoulder Row with Dumbbell - 1 x daily - 7 x weekly - 1 sets - 10 reps - 3 sec hold  Scaption with Dumbbells - 2 x daily - 7 x weekly - 1 sets - 10 reps - 3 sec hold  Sidelying Shoulder External Rotation Dumbbell - 2 x daily - 7 x weekly - 1 sets - 10 reps - 3 sec hold

## 2021-04-09 ENCOUNTER — Ambulatory Visit (INDEPENDENT_AMBULATORY_CARE_PROVIDER_SITE_OTHER): Payer: BC Managed Care – PPO | Admitting: Physical Therapy

## 2021-04-09 ENCOUNTER — Other Ambulatory Visit: Payer: Self-pay

## 2021-04-09 DIAGNOSIS — M7502 Adhesive capsulitis of left shoulder: Secondary | ICD-10-CM | POA: Diagnosis not present

## 2021-04-09 DIAGNOSIS — R29898 Other symptoms and signs involving the musculoskeletal system: Secondary | ICD-10-CM

## 2021-04-09 DIAGNOSIS — R293 Abnormal posture: Secondary | ICD-10-CM

## 2021-04-09 NOTE — Therapy (Addendum)
[place], [place], [address] Phone: [phone]   Fax:  [phone]  Physical Therapy Treatment  Patient Details  Name: Steve Briggs MRN: [id number] Date of Birth: [birth year] Referring Provider (PT): Dr Dianah Field   Encounter Date: 04/09/2021   PT End of Session - 04/09/21 1404    Visit Number 5    Number of Visits 12    Date for PT Re-Evaluation 05/04/21    PT Start Time 1404    PT Stop Time 1443    PT Time Calculation (min) 39 min    Activity Tolerance Patient tolerated treatment well    Behavior During Therapy [facility] for tasks assessed/performed           Past Medical History:  Diagnosis Date  . Hyperlipidemia   . Hypertension     Past Surgical History:  Procedure Laterality Date  . APPENDECTOMY    . MANDIBLE SURGERY    . NOSE SURGERY      There were no vitals filed for this visit.   Subjective Assessment - 04/09/21 1405    Subjective Pt reports he did his exercises this morning.  "I've gotten so much more mobility than I had 3 wks ago".  He reports he played golf last week.  Bought a new pillow, but didn't sleep well last night.    Currently in Pain? Yes    Pain Score 1     Pain Location Shoulder    Pain Orientation Left    Pain Descriptors / Indicators Dull    Aggravating Factors  movement to end range    Pain Relieving Factors ?              [facility] PT Assessment - 04/09/21 0001      Assessment   Medical Diagnosis Lt adhesive capsulitis    Referring Provider (PT) Dr Dianah Field    Onset Date/Surgical Date 01/10/21    Hand Dominance Right    Next MD Visit 04/14/21    Prior Therapy none      AROM   Right Shoulder Extension 60 Degrees    Right Shoulder Flexion 140 Degrees    Right Shoulder ABduction 148 Degrees    Right Shoulder Internal Rotation --   thumb to T8   Right Shoulder External Rotation 80 Degrees    Left Shoulder Extension 50 Degrees    Left  Shoulder Flexion 130 Degrees    Left Shoulder ABduction 135 Degrees    Left Shoulder Internal Rotation --   thumb to T10   Left Shoulder External Rotation 56 Degrees   arm abdct 90 deg           [facility] Adult PT Treatment/Exercise - 04/09/21 0001      Shoulder Exercises: Supine   Horizontal ABduction AAROM;Both   3 reps with cane/ and horiz add   External Rotation Both;10 reps;Strengthening    Theraband Level (Shoulder External Rotation) Level 2 (Red)    Flexion Both;5 reps;AAROM   with cane, 10 sec hold     Shoulder Exercises: Seated   Row Strengthening;Both;10 reps    Theraband Level (Shoulder Row) Level 2 (Red)    External Rotation Strengthening;Both;5 reps    Theraband Level (Shoulder External Rotation) Level 2 (Red)    External Rotation Limitations switched to supine      Shoulder Exercises: Sidelying   Other Sidelying Exercises sleeper stretch x 2 reps into Lt ER, 1 rep into IR.  Shoulder Exercises: ROM/Strengthening   UBE (Upper Arm Bike) L4 x 3 min 1.5 fwd/1.5 bck      Shoulder Exercises: Stretch   Cross Chest Stretch 1 rep;20 seconds    External Rotation Stretch 5 reps;20 seconds   supine with cane on diagonal   Table Stretch - Flexion 2 reps;10 seconds    Star Gazer Stretch 30 seconds;3 reps   last rep with LTR   Other Shoulder Stretches low/mid level doorway stretch - not tolerated.  pec wall stretch x 2 reps of 15 sec with improved tolerance.              PT Long Term Goals - 04/09/21 1408      PT LONG TERM GOAL #1   Title Improve posture and alignment with patient to demonstrate improved thoracic extension and upright posture with posterior shoulder girdle musculature engaged    Time 6    Period Weeks    Status On-going      PT LONG TERM GOAL #2   Title Increase AROM Lt shoulder to =/> than AROM Rt shoulder with minimal to no pain    Time 6    Period Weeks    Status On-going      PT LONG TERM GOAL #3   Title Patient to report ability to return  to normal functional and recreational activites with minimal to no pain in Lt shoulder    Time 6    Period Weeks    Status On-going      PT LONG TERM GOAL #4   Title Independent in HEP    Time 6    Period Weeks    Status On-going      PT LONG TERM GOAL #5   Title Improve functional limitation score to 73    Time 6    Period Weeks    Status On-going                 Plan - 04/09/21 1412    Clinical Impression Statement Pt has difficulty tolerating doorway stretch at any level.  Modified to supine ER with cane and wall pec stretch with elbow straight.  Pt tolerated all exercises well, reporting increased discomfort if pushing limits of range.  Pt's Lt shoulder ROM has improved; continued limitations in ER, and end range flexion/ abdct.  Progressing well towards goals.    Rehab Potential Good    PT Frequency 2x / week    PT Duration 6 weeks    PT Treatment/Interventions ADLs/Self Care Home Management;Aquatic Therapy;Cryotherapy;Electrical Stimulation;Iontophoresis 15m/ml Dexamethasone;Moist Heat;Ultrasound;Functional mobility training;Therapeutic activities;Therapeutic exercise;Neuromuscular re-education;Patient/family education;Manual techniques;Passive range of motion;Dry needling;Taping;Vasopneumatic Device    PT Next Visit Plan progress ROM exercises; assess response to manual work and PLexicographer work on pGeophysical data processor modlaities as indicated    PT Home Exercise Plan [phone]   Consulted and Agree with Plan of Care Patient           Patient will benefit from skilled therapeutic intervention in order to improve the following deficits and impairments:  Decreased range of motion,Increased fascial restricitons,Increased muscle spasms,Impaired UE functional use,Decreased activity tolerance,Pain,Impaired flexibility,Improper body mechanics,Decreased mobility,Postural dysfunction  Visit Diagnosis: Adhesive capsulitis of left shoulder  Abnormal  posture  Other symptoms and signs involving the musculoskeletal system     Problem List Patient Active Problem List   Diagnosis Date Noted  . Adhesive capsulitis of left shoulder 03/03/2021   JKerin Perna PTA 04/09/21 5:23 PM  [facility] [address] [place], [place], [address] Phone: [phone]   Fax:  [phone]  Name: Steve Briggs MRN: [id number] Date of Birth: Nov 28, 1961  PHYSICAL THERAPY DISCHARGE SUMMARY  Visits from Start of Care: 5  Current functional level related to goals / functional outcomes: See progress note for discharge status    Remaining deficits: Unknown   Education / Equipment: HEP  Plan: Patient agrees to discharge.  Patient goals were partially met. Patient is being discharged due to not returning since the last visit.  ?????    Celyn P. Helene Kelp PT, MPH 05/07/21 9:33 AM

## 2021-04-14 ENCOUNTER — Other Ambulatory Visit: Payer: Self-pay

## 2021-04-14 ENCOUNTER — Ambulatory Visit (INDEPENDENT_AMBULATORY_CARE_PROVIDER_SITE_OTHER): Payer: Medicare (Managed Care) | Admitting: Sports Medicine

## 2021-04-14 DIAGNOSIS — M7502 Adhesive capsulitis of left shoulder: Secondary | ICD-10-CM

## 2021-04-14 NOTE — Assessment & Plan Note (Signed)
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 He had no discrete injuries. He had a lot of lag with external rotation and weakness to abduction. My suspicion was adhesive capsulitis and rotator cuff dysfunction, we injected his glenohumeral joint, got an x-ray, MRI, and put him in formal PT, he returns today about 80% better, MRI did show capsular thickening consistent with adhesive capsulitis, no rotator cuff tearing. He continues to improve with PT so he will continue until they graduate him to a home exercise program and he can come see me on an as-needed basis. If he plateaus at a point where he cannot live with it we will proceed with high-volume hydrodistention of the glenohumeral joint, if this fails we will refer to Dr. Everardo Pacific for lysis of adhesions arthroscopically.

## 2021-04-14 NOTE — Progress Notes (Signed)
    Procedures performed today:    None.  Independent interpretation of notes and tests performed by another provider:   None.  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 He had no discrete injuries. He had a lot of lag with external rotation and weakness to abduction. My suspicion was adhesive capsulitis and rotator cuff dysfunction, we injected his glenohumeral joint, got an x-ray, MRI, and put him in formal PT, he returns today about 80% better, MRI did show capsular thickening consistent with adhesive capsulitis, no rotator cuff tearing. He continues to improve with PT so he will continue until they graduate him to a home exercise program and he can come see me on an as-needed basis. If he plateaus at a point where he cannot live with it we will proceed with high-volume hydrodistention of the glenohumeral joint, if this fails we will refer to Dr. Everardo Pacific for lysis of adhesions arthroscopically.    ___________________________________________ Ihor Austin. Benjamin Stain, M.D., ABFM., CAQSM. Primary Care and Sports Medicine  [facility]  Adjunct Instructor of Family Medicine  [employer]

## 2021-04-15 DIAGNOSIS — M419 Scoliosis, unspecified: Secondary | ICD-10-CM | POA: Diagnosis not present

## 2021-04-15 DIAGNOSIS — R2689 Other abnormalities of gait and mobility: Secondary | ICD-10-CM | POA: Diagnosis not present

## 2021-04-15 DIAGNOSIS — M6281 Muscle weakness (generalized): Secondary | ICD-10-CM | POA: Diagnosis not present

## 2021-04-16 ENCOUNTER — Encounter: Payer: BC Managed Care – PPO | Admitting: Rehabilitative and Restorative Service Providers"

## 2021-04-16 DIAGNOSIS — C44319 Basal cell carcinoma of skin of other parts of face: Secondary | ICD-10-CM | POA: Diagnosis not present

## 2021-04-20 ENCOUNTER — Encounter: Payer: Medicare (Managed Care) | Admitting: Rehabilitative and Restorative Service Providers"

## 2021-04-20 DIAGNOSIS — R2689 Other abnormalities of gait and mobility: Secondary | ICD-10-CM | POA: Diagnosis not present

## 2021-04-20 DIAGNOSIS — M419 Scoliosis, unspecified: Secondary | ICD-10-CM | POA: Diagnosis not present

## 2021-04-20 DIAGNOSIS — M6281 Muscle weakness (generalized): Secondary | ICD-10-CM | POA: Diagnosis not present

## 2021-04-27 ENCOUNTER — Telehealth: Payer: Self-pay | Admitting: Rehabilitative and Restorative Service Providers"

## 2021-04-27 ENCOUNTER — Encounter: Payer: Medicare (Managed Care) | Admitting: Rehabilitative and Restorative Service Providers"

## 2021-04-27 NOTE — Telephone Encounter (Signed)
Patient was scheduled for appointment today at 3:15 but failed to show for appointment. Left message asking that Steve Briggs call to let us know if he wanted to schedule any further appointments or felt confident in discontinuing therapy at this point.   Guerino Caporale P. Leonor Liv PT, MPH 04/27/21 3:59 PM

## 2021-04-28 DIAGNOSIS — R2689 Other abnormalities of gait and mobility: Secondary | ICD-10-CM | POA: Diagnosis not present

## 2021-04-28 DIAGNOSIS — M419 Scoliosis, unspecified: Secondary | ICD-10-CM | POA: Diagnosis not present

## 2021-04-28 DIAGNOSIS — M6281 Muscle weakness (generalized): Secondary | ICD-10-CM | POA: Diagnosis not present

## 2021-05-11 DIAGNOSIS — M419 Scoliosis, unspecified: Secondary | ICD-10-CM | POA: Diagnosis not present

## 2021-05-11 DIAGNOSIS — R2689 Other abnormalities of gait and mobility: Secondary | ICD-10-CM | POA: Diagnosis not present

## 2021-05-11 DIAGNOSIS — M6281 Muscle weakness (generalized): Secondary | ICD-10-CM | POA: Diagnosis not present

## 2021-05-18 ENCOUNTER — Ambulatory Visit (INDEPENDENT_AMBULATORY_CARE_PROVIDER_SITE_OTHER): Payer: Medicare (Managed Care) | Admitting: Sports Medicine

## 2021-05-18 ENCOUNTER — Ambulatory Visit (INDEPENDENT_AMBULATORY_CARE_PROVIDER_SITE_OTHER): Payer: Medicare (Managed Care)

## 2021-05-18 ENCOUNTER — Other Ambulatory Visit: Payer: Self-pay

## 2021-05-18 DIAGNOSIS — M7502 Adhesive capsulitis of left shoulder: Secondary | ICD-10-CM | POA: Diagnosis not present

## 2021-05-18 DIAGNOSIS — M7542 Impingement syndrome of left shoulder: Secondary | ICD-10-CM | POA: Insufficient documentation

## 2021-05-18 NOTE — Assessment & Plan Note (Signed)
Jorja Loa returns, he is a pleasant 60 year old male with persistent shoulder pain, historically we treated him for adhesive capsulitis, MRI at the time showed capsular thickening consistent with adhesive capsulitis as well as rotator cuff tendinopathy. He had a glenohumeral joint injection that seemed to resolve adhesive capsulitis symptoms, unfortunately still has impingement type symptoms with a positive Neer's, Hawkins signs and tenderness over the deltoid. Today we performed a subacromial injection, rotator cuff conditioning provided, return to see me in 6 weeks.

## 2021-05-18 NOTE — Progress Notes (Signed)
    Procedures performed today:    Procedure: Real-time Ultrasound Guided injection of the left subacromial bursa Device: Samsung HS60  Verbal informed consent obtained.  Time-out conducted.  Noted no overlying erythema, induration, or other signs of local infection.  Skin prepped in a sterile fashion.  Local anesthesia: Topical Ethyl chloride.  With sterile technique and under real time ultrasound guidance:  Noted intact rotator cuff, 1 cc Kenalog 40, 1 cc lidocaine,1  cc bupivacaine injected easily Completed without difficulty  Advised to call if fevers/chills, erythema, induration, drainage, or persistent bleeding.  Images permanently stored and available for review in PACS.  Impression: Technically successful ultrasound guided injection.  Independent interpretation of notes and tests performed by another provider:   None.  Brief History, Exam, Impression, and Recommendations:    Impingement syndrome, shoulder, left Steve Briggs returns, he is a pleasant 60 year old male with persistent shoulder pain, historically we treated him for adhesive capsulitis, MRI at the time showed capsular thickening consistent with adhesive capsulitis as well as rotator cuff tendinopathy. He had a glenohumeral joint injection that seemed to resolve adhesive capsulitis symptoms, unfortunately still has impingement type symptoms with a positive Neer's, Hawkins signs and tenderness over the deltoid. Today we performed a subacromial injection, rotator cuff conditioning provided, return to see me in 6 weeks.  Adhesive capsulitis of left shoulder From a adhesive capsulitis standpoint I think symptoms have resolved, note MRI results below, glenohumeral joint injection back in March seem to provide good relief. If this seems to recur we will proceed with high-volume hydrodistention of the glenohumeral joint followed by referral to Dr. Everardo Pacific for arthroscopic lysis of adhesions if  needed.    ___________________________________________ Ihor Austin. Benjamin Stain, M.D., ABFM., CAQSM. Primary Care and Sports Medicine [place] [facility]  Adjunct Instructor of Family Medicine  [employer]

## 2021-05-18 NOTE — Assessment & Plan Note (Signed)
From a adhesive capsulitis standpoint I think symptoms have resolved, note MRI results below, glenohumeral joint injection back in March seem to provide good relief. If this seems to recur we will proceed with high-volume hydrodistention of the glenohumeral joint followed by referral to Dr. Everardo Pacific for arthroscopic lysis of adhesions if needed.

## 2021-06-30 ENCOUNTER — Ambulatory Visit: Payer: Medicare (Managed Care) | Admitting: Sports Medicine

## 2022-02-08 IMAGING — MR MR SHOULDER*L* W/O CM
5 series · 40 of 40 positions shown · non-contrast
Comparison: Three views of the left shoulder 03/09/2021.

CLINICAL DATA: Right shoulder pain and limited range of motion for
approximately 2 months. Question adhesive capsulitis.

EXAM:
MRI OF THE LEFT SHOULDER WITHOUT CONTRAST
TECHNIQUE: Multiplanar, multisequence MR imaging of the shoulder was performed.
No intravenous contrast was administered.

[Series 5: T2 fat-sat · axial · 4.0mm · 0.59mm/px · z∈[-36,+40]mm · 8 of 20 slices shown (1 of 3)]
[im 1/20]
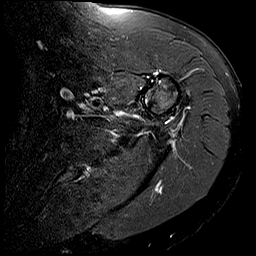
[im 3/20]
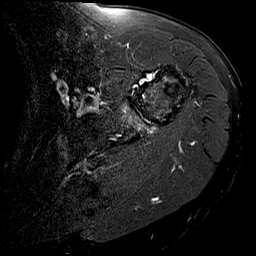
[im 6/20]
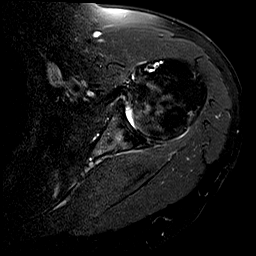
[im 9/20]
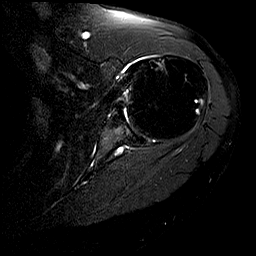
[im 11/20]
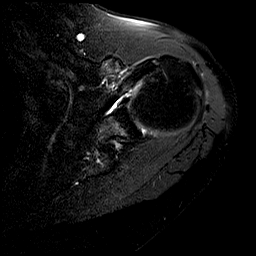
[im 14/20]
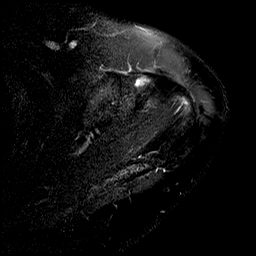
[im 17/20]
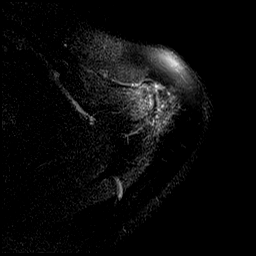
[im 20/20]
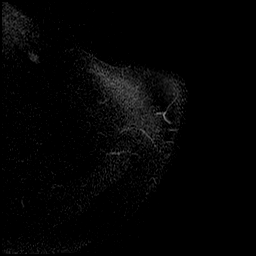

[Series 7: PD fat-sat · oblique · 4.0mm · 0.59mm/px · 7 of 17 slices shown]
[im 1/17]
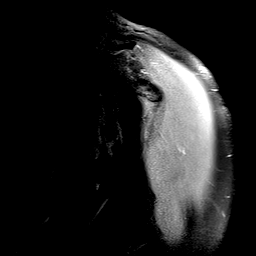
[im 3/17]
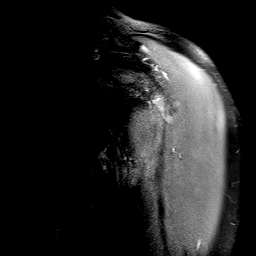
[im 6/17]
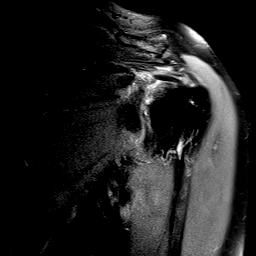
[im 9/17]
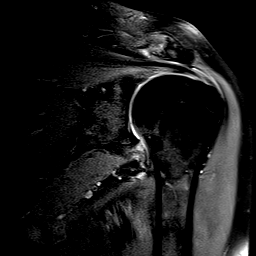
[im 11/17]
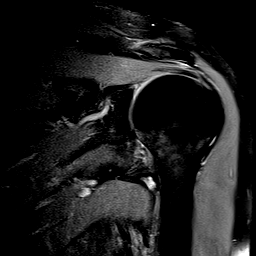
[im 14/17]
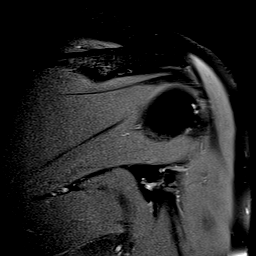
[im 17/17]
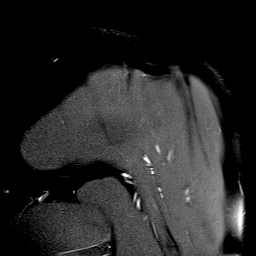

[Series 8: T1 · oblique · 4.0mm · 0.59mm/px · 9 of 22 slices shown]
[im 1/22]
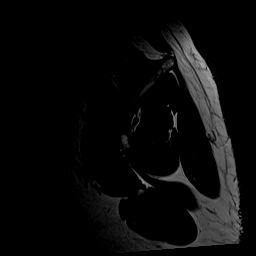
[im 3/22]
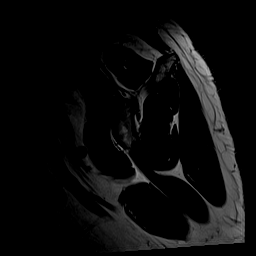
[im 6/22]
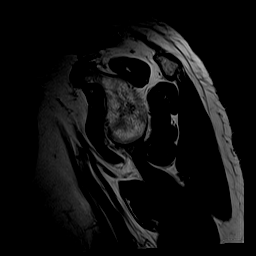
[im 8/22]
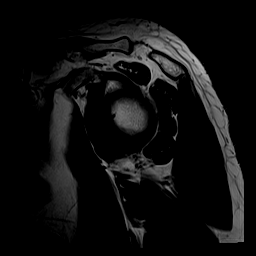
[im 11/22]
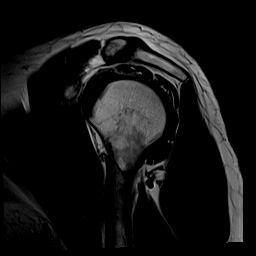
[im 14/22]
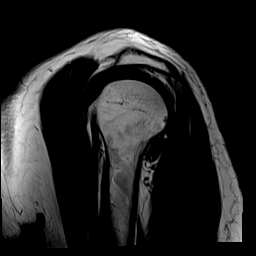
[im 16/22]
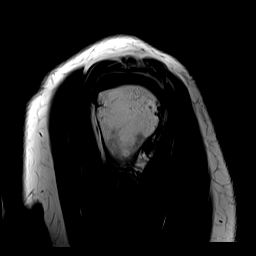
[im 19/22]
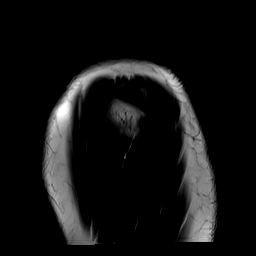
[im 22/22]
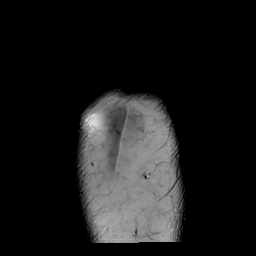

[Series 9: T2 fat-sat · oblique · 4.0mm · 0.55mm/px · 9 of 22 slices shown (2 of 3)]
[im 1/22]
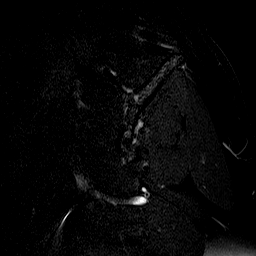
[im 3/22]
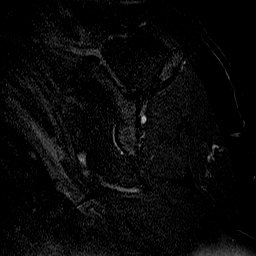
[im 6/22]
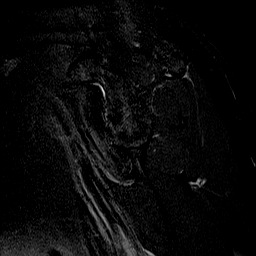
[im 8/22]
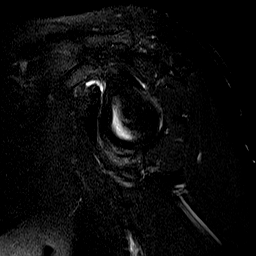
[im 11/22]
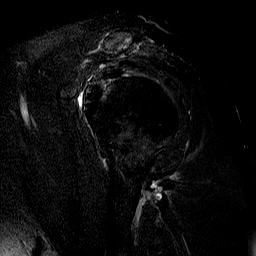
[im 14/22]
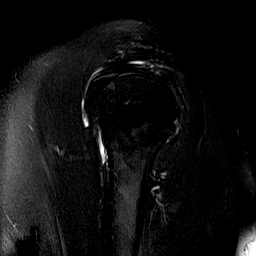
[im 16/22]
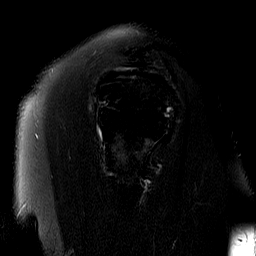
[im 19/22]
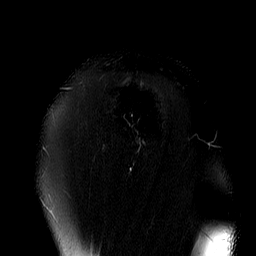
[im 22/22]
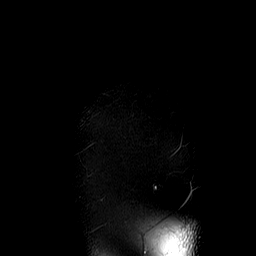

[Series 10: T2 fat-sat · oblique · 4.0mm · 0.59mm/px · 7 of 17 slices shown (3 of 3)]
[im 1/17]
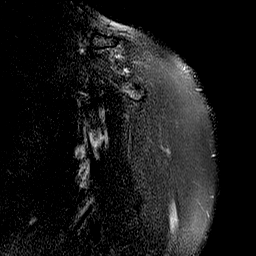
[im 3/17]
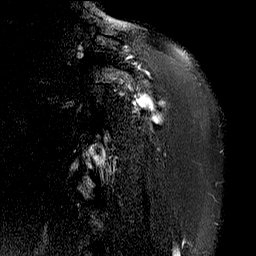
[im 6/17]
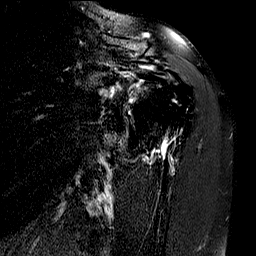
[im 9/17]
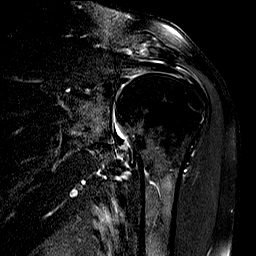
[im 11/17]
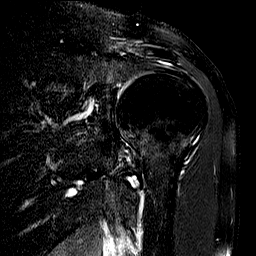
[im 14/17]
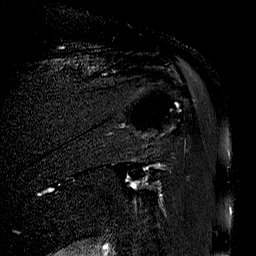
[im 17/17]
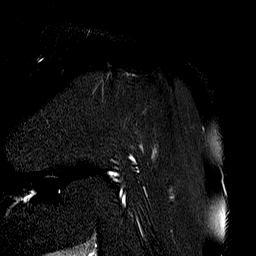

[40 of 40 positions shown; findings below may reference images not displayed]

FINDINGS: Rotator cuff: Intact. Mild supraspinatus and infraspinatus
tendinopathy noted.

Muscles:  Normal without atrophy or focal lesion.

Biceps long head:  Intact and normal in appearance.

Acromioclavicular Joint: Moderate osteoarthritis. Type 2 acromion.
No subacromial/subdeltoid bursal fluid.

Glenohumeral Joint: There is mild thickening and intermediate
increased T2 signal in the inferior glenohumeral ligament.

Labrum:  Intact.

Bones:  No fracture or worrisome lesion.

Other: None.
IMPRESSION: Mild appearing supraspinatus and infraspinatus tendinopathy without
tear.

Moderate acromioclavicular osteoarthritis.

Findings compatible with adhesive capsulitis.

## 2022-02-08 IMAGING — DX DG SHOULDER 2+V*L*
2 series · 2 of 2 positions shown · non-contrast
Comparison: None.

CLINICAL DATA: Reduced range of motion without known injury.

EXAM:
LEFT SHOULDER - 2+ VIEW

[shoulder grashey]
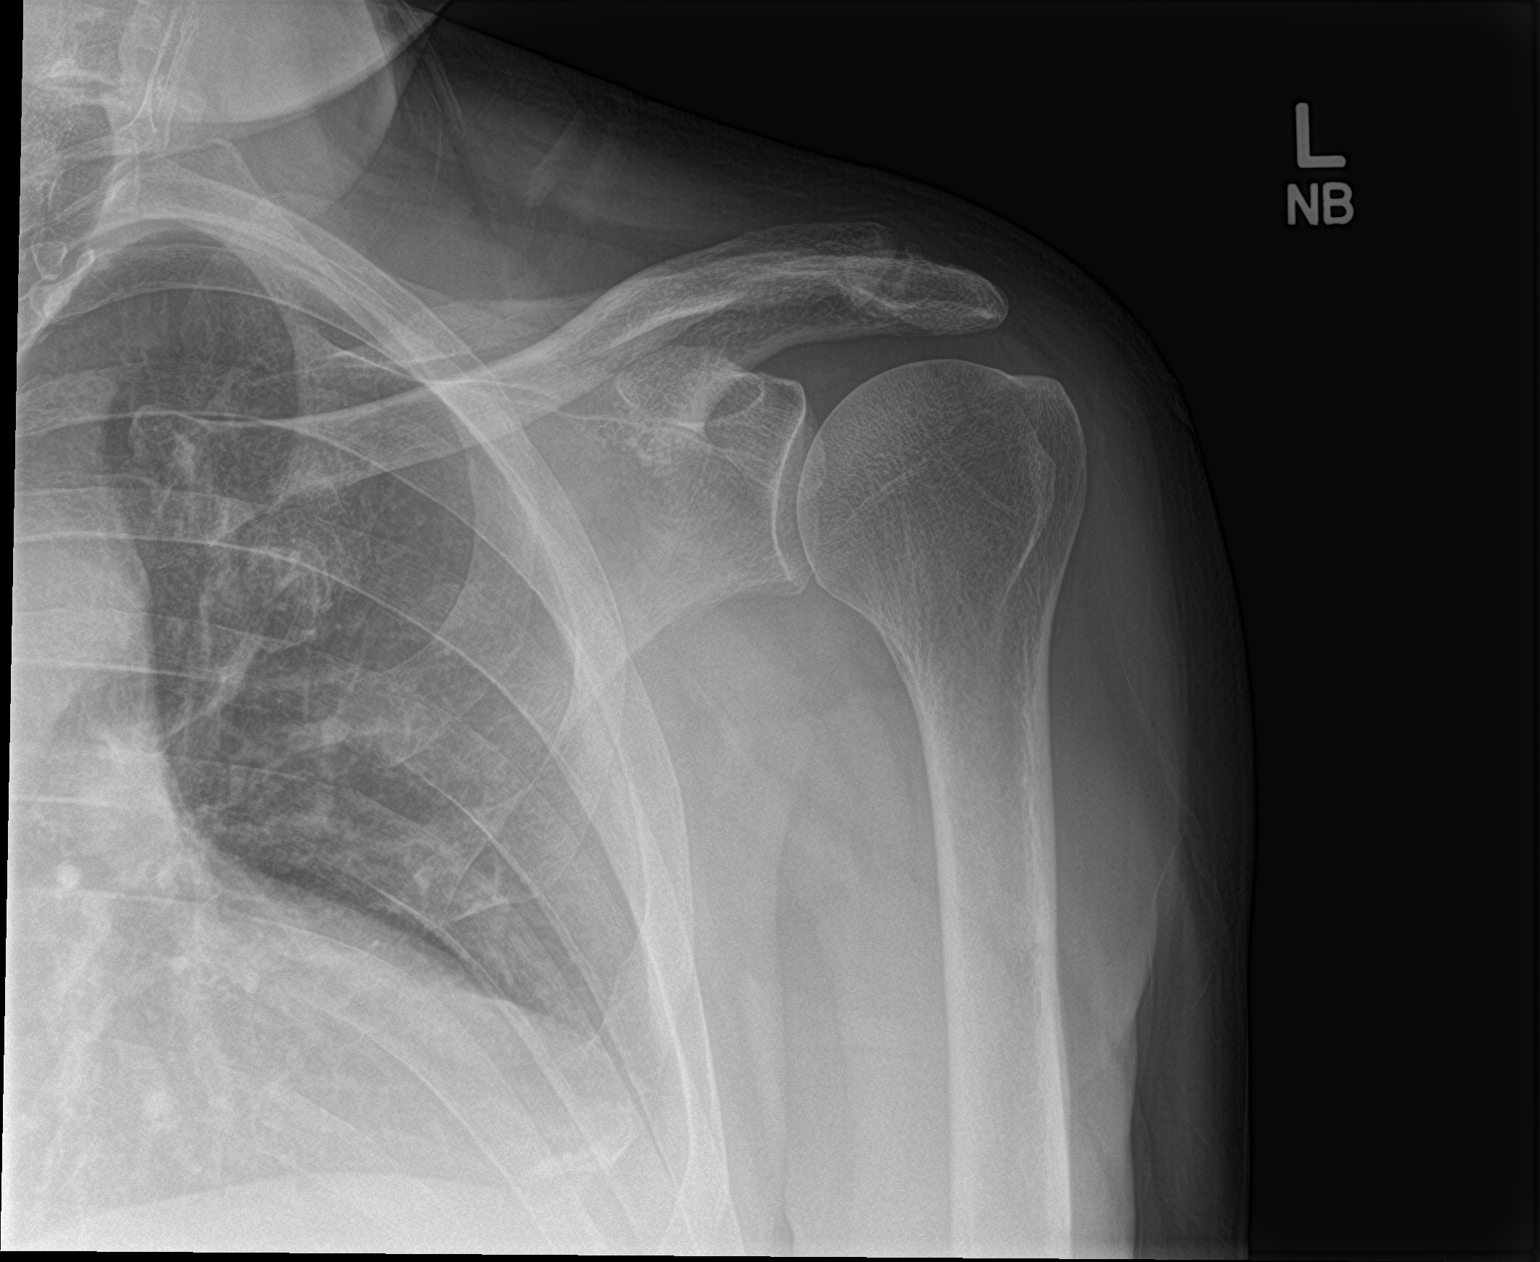

[shoulder y view]
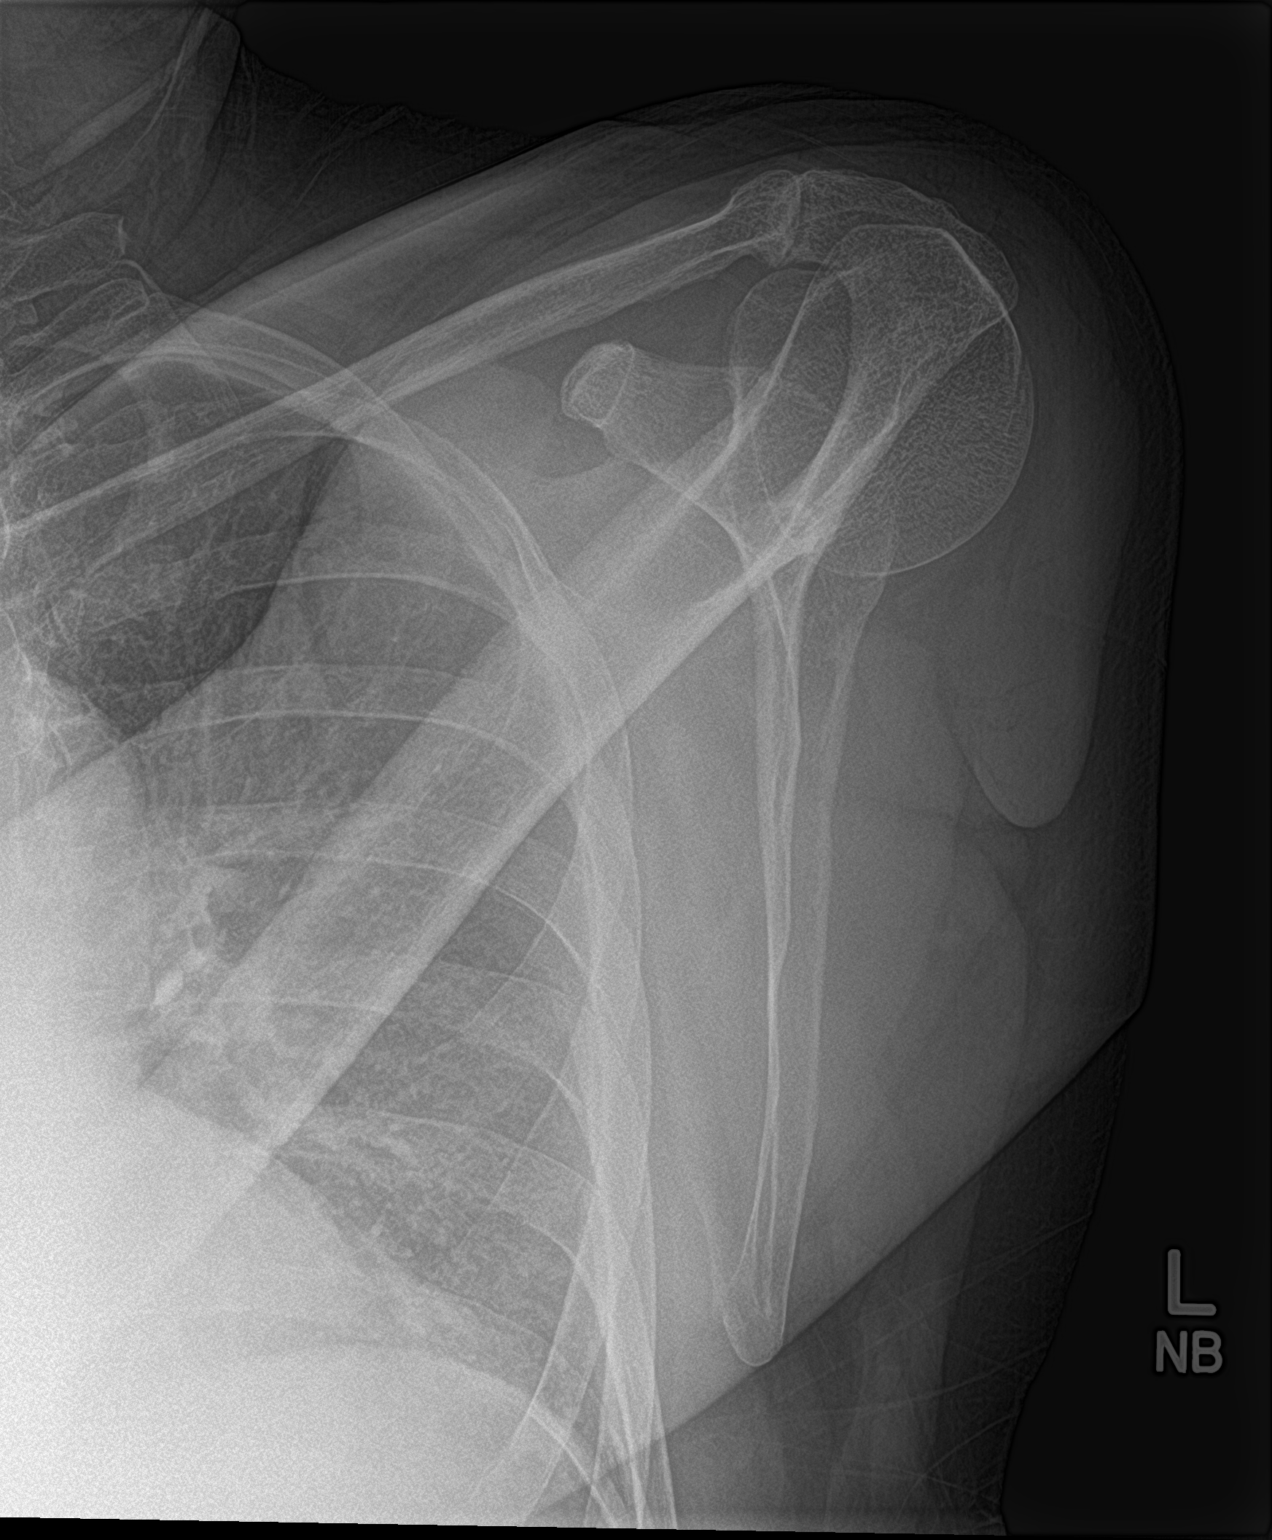

[2 of 2 positions shown; findings below may reference images not displayed]

FINDINGS: There is no evidence of fracture or dislocation. There is no
evidence of arthropathy or other focal bone abnormality. Soft
tissues are unremarkable.
IMPRESSION: Negative.

## 2022-05-20 ENCOUNTER — Ambulatory Visit (INDEPENDENT_AMBULATORY_CARE_PROVIDER_SITE_OTHER): Payer: BC Managed Care – PPO

## 2022-05-20 ENCOUNTER — Ambulatory Visit (INDEPENDENT_AMBULATORY_CARE_PROVIDER_SITE_OTHER): Payer: Medicare (Managed Care) | Admitting: Sports Medicine

## 2022-05-20 DIAGNOSIS — M25562 Pain in left knee: Secondary | ICD-10-CM | POA: Diagnosis not present

## 2022-05-20 DIAGNOSIS — M47816 Spondylosis without myelopathy or radiculopathy, lumbar region: Secondary | ICD-10-CM

## 2022-05-20 DIAGNOSIS — M25462 Effusion, left knee: Secondary | ICD-10-CM

## 2022-05-20 DIAGNOSIS — M545 Low back pain, unspecified: Secondary | ICD-10-CM

## 2022-05-20 DIAGNOSIS — Z09 Encounter for follow-up examination after completed treatment for conditions other than malignant neoplasm: Secondary | ICD-10-CM | POA: Diagnosis not present

## 2022-05-20 MED ORDER — PREDNISONE 50 MG PO TABS: ORAL_TABLET | ORAL | 0 refills | Status: AC

## 2022-05-20 MED ORDER — MELOXICAM 15 MG PO TABS: ORAL_TABLET | ORAL | 3 refills | Status: AC

## 2022-05-20 NOTE — Assessment & Plan Note (Signed)
Moderate knee effusion, no mechanical symptoms, benign exam with the exception of the swelling. Likely some osteoarthritis, adding x-rays, Mobic, he will do icing and compression sleeve, home conditioning given, return to see me in 6 weeks, injection if not better.

## 2022-05-20 NOTE — Assessment & Plan Note (Signed)
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 Discussed the anatomy and pathophysiology, we will do home conditioning, 5 days of steroids followed by Mobic, x-rays. Return to see me in 6 weeks.

## 2022-05-20 NOTE — Progress Notes (Signed)
    Procedures performed today:    None.  Independent interpretation of notes and tests performed by another provider:   None.  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 Discussed the anatomy and pathophysiology, we will do home conditioning, 5 days of steroids followed by Mobic, x-rays. Return to see me in 6 weeks.  Effusion of knee joint, left Moderate knee effusion, no mechanical symptoms, benign exam with the exception of the swelling. Likely some osteoarthritis, adding x-rays, Mobic, he will do icing and compression sleeve, home conditioning given, return to see me in 6 weeks, injection if not better.  Chronic process with exacerbation and pharmacologic intervention  ___________________________________________ Steve Briggs. Benjamin Stain, M.D., ABFM., CAQSM. Primary Care and Sports Medicine [place] [facility]  Adjunct Instructor of Family Medicine  [employer]

## 2022-07-05 ENCOUNTER — Ambulatory Visit (INDEPENDENT_AMBULATORY_CARE_PROVIDER_SITE_OTHER): Payer: Medicare (Managed Care) | Admitting: Sports Medicine

## 2022-07-05 DIAGNOSIS — M47816 Spondylosis without myelopathy or radiculopathy, lumbar region: Secondary | ICD-10-CM | POA: Diagnosis not present

## 2022-07-05 DIAGNOSIS — M25462 Effusion, left knee: Secondary | ICD-10-CM

## 2022-07-05 NOTE — Assessment & Plan Note (Signed)
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 He will wear a knee brace when playing golf and return to see me as needed.

## 2022-07-05 NOTE — Assessment & Plan Note (Signed)
Axial discogenic back pain with minimal DDD on lumbar spine x-rays, also some lower lumbar facet arthritis. Resolved with home conditioning, steroids and occasional Mobic. Return to see me as needed for this.

## 2022-07-05 NOTE — Progress Notes (Signed)
    Procedures performed today:    None.  Independent interpretation of notes and tests performed by another provider:   None.  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 He will wear a knee brace when playing golf and return to see me as needed.  Lumbar spondylosis Axial discogenic back pain with minimal DDD on lumbar spine x-rays, also some lower lumbar facet arthritis. Resolved with home conditioning, steroids and occasional Mobic. Return to see me as needed for this.    ____________________________________________ Ihor Austin. Benjamin Stain, M.D., ABFM., CAQSM., AME. Primary Care and Sports Medicine  [facility]  Adjunct Professor of Family Medicine  [employer] [facility]  Restaurant manager, fast food

## 2024-01-31 ENCOUNTER — Ambulatory Visit (INDEPENDENT_AMBULATORY_CARE_PROVIDER_SITE_OTHER): Payer: Medicare (Managed Care) | Admitting: Sports Medicine

## 2024-01-31 ENCOUNTER — Ambulatory Visit (INDEPENDENT_AMBULATORY_CARE_PROVIDER_SITE_OTHER): Payer: BC Managed Care – PPO

## 2024-01-31 ENCOUNTER — Encounter: Payer: Self-pay | Admitting: Sports Medicine

## 2024-01-31 ENCOUNTER — Other Ambulatory Visit: Payer: BC Managed Care – PPO

## 2024-01-31 DIAGNOSIS — M75101 Unspecified rotator cuff tear or rupture of right shoulder, not specified as traumatic: Secondary | ICD-10-CM | POA: Diagnosis not present

## 2024-01-31 DIAGNOSIS — M7551 Bursitis of right shoulder: Secondary | ICD-10-CM | POA: Insufficient documentation

## 2024-01-31 DIAGNOSIS — M19011 Primary osteoarthritis, right shoulder: Secondary | ICD-10-CM | POA: Diagnosis not present

## 2024-01-31 NOTE — Progress Notes (Signed)
    Procedures performed today:    None.  Independent interpretation of notes and tests performed by another provider:   None.  Brief History, Exam, Impression, and Recommendations:    Rotator cuff tear, right Pleasant 63 year old male, history of left shoulder adhesive capsulitis successfully treated with injection. For the past 8 months he has had increasing discomfort in her right shoulder, significant weakness, difficulty with abduction. On exam he has significant weakness to external rotation and abduction, positive impingement signs with a positive Neer's test, Hawkins and empty can signs, he has a positive crank test. Positive speeds test. This is concerning for rotator cuff disruption. Overall passive range of motion is good pointing away from adhesive capsulitis. I do think we should get an x-ray and go ahead and proceed with MRI for surgical planning.    ____________________________________________ Debby PARAS. Curtis, M.D., ABFM., CAQSM., AME. Primary Care and Sports Medicine [place] [facility]  Adjunct Professor of [facility] [employer]  [employer]  Restaurant Manager, [employer]

## 2024-01-31 NOTE — Assessment & Plan Note (Signed)
 Pleasant 63 year old male, history of left shoulder adhesive capsulitis successfully treated with injection. For the past 8 months he has had increasing discomfort in her right shoulder, significant weakness, difficulty with abduction. On exam he has significant weakness to external rotation and abduction, positive impingement signs with a positive Neer's test, Hawkins and empty can signs, he has a positive crank test. Positive speeds test. This is concerning for rotator cuff disruption. Overall passive range of motion is good pointing away from adhesive capsulitis. I do think we should get an x-ray and go ahead and proceed with MRI for surgical planning.

## 2024-02-01 ENCOUNTER — Telehealth: Payer: Self-pay | Admitting: Sports Medicine

## 2024-02-01 NOTE — Telephone Encounter (Signed)
 Peer-to-peer done, approved, #[id number] exp 03/01/24

## 2024-02-04 ENCOUNTER — Ambulatory Visit: Payer: BC Managed Care – PPO

## 2024-02-04 DIAGNOSIS — G8929 Other chronic pain: Secondary | ICD-10-CM

## 2024-02-04 DIAGNOSIS — M25511 Pain in right shoulder: Secondary | ICD-10-CM

## 2024-02-04 DIAGNOSIS — R531 Weakness: Secondary | ICD-10-CM

## 2024-02-04 DIAGNOSIS — M75101 Unspecified rotator cuff tear or rupture of right shoulder, not specified as traumatic: Secondary | ICD-10-CM

## 2024-02-14 ENCOUNTER — Ambulatory Visit (INDEPENDENT_AMBULATORY_CARE_PROVIDER_SITE_OTHER): Payer: Medicare (Managed Care) | Admitting: Sports Medicine

## 2024-02-14 ENCOUNTER — Other Ambulatory Visit: Payer: Self-pay

## 2024-02-14 DIAGNOSIS — M7551 Bursitis of right shoulder: Secondary | ICD-10-CM

## 2024-02-14 DIAGNOSIS — M75101 Unspecified rotator cuff tear or rupture of right shoulder, not specified as traumatic: Secondary | ICD-10-CM

## 2024-02-14 NOTE — Assessment & Plan Note (Signed)
 63 year old male, increasing shoulder pain, history of left shoulder adhesive capsulitis successfully treated with a glenohumeral injection. Right shoulder pain was more impingement and its presentation, he had good motion but significant weakness to abduction, external rotation with positive impingement signs.  MRI did show subacromial bursitis without significant rotator cuff tearing. Today we did a subacromial injection, he will add home physical therapy and return to see me in 6 weeks.

## 2024-02-14 NOTE — Progress Notes (Signed)
    Procedures performed today:    Procedure: Real-time Ultrasound Guided injection of the right subacromial bursa Device: Samsung HS60  Verbal informed consent obtained.  Time-out conducted.  Noted no overlying erythema, induration, or other signs of local infection.  Skin prepped in a sterile fashion.  Local anesthesia: Topical Ethyl chloride.  With sterile technique and under real time ultrasound guidance: Rotator cuff noted intact, minimal bursitis, 1 cc Kenalog 40, 1cc lidocaine, 1 cc bupivacaine injected easily Completed without difficulty  Advised to call if fevers/chills, erythema, induration, drainage, or persistent bleeding.  Images permanently stored and available for review in PACS.  Impression: Technically successful ultrasound guided injection.  Independent interpretation of notes and tests performed by another provider:   None.  Brief History, Exam, Impression, and Recommendations:    Subacromial bursitis of right shoulder joint 63 year old male, increasing shoulder pain, history of left shoulder adhesive capsulitis successfully treated with a glenohumeral injection. Right shoulder pain was more impingement and its presentation, he had good motion but significant weakness to abduction, external rotation with positive impingement signs.  MRI did show subacromial bursitis without significant rotator cuff tearing. Today we did a subacromial injection, he will add home physical therapy and return to see me in 6 weeks.     ____________________________________________ Ihor Austin. Benjamin Stain, M.D., ABFM., CAQSM., AME. Primary Care and Sports Medicine [place] [facility]  Adjunct Professor of Family Medicine  [employer]  Restaurant manager, fast food

## 2024-02-17 DIAGNOSIS — M75101 Unspecified rotator cuff tear or rupture of right shoulder, not specified as traumatic: Secondary | ICD-10-CM

## 2024-02-17 DIAGNOSIS — M7551 Bursitis of right shoulder: Secondary | ICD-10-CM

## 2024-02-17 MED ORDER — TRIAMCINOLONE ACETONIDE 40 MG/ML IJ SUSP
40.0000 mg | Freq: Once | INTRAMUSCULAR | Status: AC
Start: 2024-02-17 — End: 2024-02-17
  Administered 2024-02-17: 40 mg via INTRAMUSCULAR

## 2024-02-17 NOTE — Addendum Note (Signed)
 Addended by: Ernest Mallick A on: 02/17/2024 09:06 AM   Modules accepted: Orders

## 2024-02-27 DIAGNOSIS — M7551 Bursitis of right shoulder: Secondary | ICD-10-CM | POA: Diagnosis not present

## 2024-02-27 DIAGNOSIS — M75101 Unspecified rotator cuff tear or rupture of right shoulder, not specified as traumatic: Secondary | ICD-10-CM | POA: Diagnosis not present

## 2024-02-27 MED ORDER — TRIAMCINOLONE ACETONIDE 40 MG/ML IJ SUSP
40.0000 mg | Freq: Once | INTRAMUSCULAR | Status: AC
  Administered 2024-02-27: 40 mg via INTRAMUSCULAR

## 2024-02-27 NOTE — Addendum Note (Signed)
 Addended by: Ernest Mallick A on: 02/27/2024 08:46 AM   Modules accepted: Orders

## 2024-03-27 ENCOUNTER — Ambulatory Visit: Payer: BC Managed Care – PPO | Admitting: Sports Medicine

## 2024-08-28 ENCOUNTER — Encounter: Payer: Self-pay | Admitting: Sports Medicine
